# Patient Record
Sex: Female | Born: 1970 | Race: White | Hispanic: No | Smoking: Current every day smoker
Health system: Southern US, Community
[De-identification: ages and names within clinical notes are randomized; demographics above are authoritative.]

## PROBLEM LIST (undated history)

## (undated) DIAGNOSIS — F329 Major depressive disorder, single episode, unspecified: Secondary | ICD-10-CM

## (undated) DIAGNOSIS — B009 Herpesviral infection, unspecified: Secondary | ICD-10-CM

## (undated) DIAGNOSIS — IMO0001 Reserved for inherently not codable concepts without codable children: Secondary | ICD-10-CM

## (undated) DIAGNOSIS — Z8701 Personal history of pneumonia (recurrent): Secondary | ICD-10-CM

## (undated) DIAGNOSIS — K449 Diaphragmatic hernia without obstruction or gangrene: Secondary | ICD-10-CM

## (undated) DIAGNOSIS — F32A Depression, unspecified: Secondary | ICD-10-CM

## (undated) DIAGNOSIS — K219 Gastro-esophageal reflux disease without esophagitis: Secondary | ICD-10-CM

## (undated) HISTORY — DX: Depression, unspecified: F32.A

## (undated) HISTORY — DX: Herpesviral infection, unspecified: B00.9

## (undated) HISTORY — PX: OTHER SURGICAL HISTORY: SHX169

## (undated) HISTORY — PX: TUBAL LIGATION: SHX77

## (undated) HISTORY — DX: Personal history of pneumonia (recurrent): Z87.01

## (undated) HISTORY — DX: Reserved for inherently not codable concepts without codable children: IMO0001

## (undated) HISTORY — DX: Gastro-esophageal reflux disease without esophagitis: K21.9

## (undated) HISTORY — DX: Diaphragmatic hernia without obstruction or gangrene: K44.9

## (undated) HISTORY — DX: Major depressive disorder, single episode, unspecified: F32.9

---

## 1994-02-14 HISTORY — PX: OTHER SURGICAL HISTORY: SHX169

## 1997-09-30 ENCOUNTER — Inpatient Hospital Stay (HOSPITAL_COMMUNITY): Admission: AD | Admit: 1997-09-30 | Discharge: 1997-09-30 | Payer: Self-pay | Admitting: Obstetrics and Gynecology

## 1997-10-02 ENCOUNTER — Inpatient Hospital Stay (HOSPITAL_COMMUNITY): Admission: AD | Admit: 1997-10-02 | Discharge: 1997-10-02 | Payer: Self-pay | Admitting: Obstetrics and Gynecology

## 1997-10-03 ENCOUNTER — Inpatient Hospital Stay (HOSPITAL_COMMUNITY): Admission: AD | Admit: 1997-10-03 | Discharge: 1997-10-05 | Payer: Self-pay | Admitting: Gynecology

## 1997-10-03 ENCOUNTER — Ambulatory Visit (HOSPITAL_COMMUNITY): Admission: RE | Admit: 1997-10-03 | Discharge: 1997-10-03 | Payer: Self-pay | Admitting: Gynecology

## 1997-10-04 ENCOUNTER — Encounter: Payer: Self-pay | Admitting: Gynecology

## 1997-10-28 ENCOUNTER — Inpatient Hospital Stay (HOSPITAL_COMMUNITY): Admission: AD | Admit: 1997-10-28 | Discharge: 1997-10-31 | Payer: Self-pay | Admitting: Gynecology

## 1997-12-10 ENCOUNTER — Ambulatory Visit (HOSPITAL_COMMUNITY): Admission: RE | Admit: 1997-12-10 | Discharge: 1997-12-10 | Payer: Self-pay | Admitting: Surgery

## 1997-12-12 ENCOUNTER — Ambulatory Visit (HOSPITAL_COMMUNITY): Admission: RE | Admit: 1997-12-12 | Discharge: 1997-12-13 | Payer: Self-pay | Admitting: Surgery

## 1997-12-12 ENCOUNTER — Encounter: Payer: Self-pay | Admitting: Surgery

## 1998-12-25 ENCOUNTER — Other Ambulatory Visit: Admission: RE | Admit: 1998-12-25 | Discharge: 1998-12-25 | Payer: Self-pay | Admitting: Gynecology

## 1999-11-09 ENCOUNTER — Ambulatory Visit (HOSPITAL_COMMUNITY): Admission: RE | Admit: 1999-11-09 | Discharge: 1999-11-09 | Payer: Self-pay | Admitting: Family Medicine

## 1999-11-09 ENCOUNTER — Encounter: Payer: Self-pay | Admitting: Family Medicine

## 1999-12-28 ENCOUNTER — Other Ambulatory Visit: Admission: RE | Admit: 1999-12-28 | Discharge: 1999-12-28 | Payer: Self-pay | Admitting: Gynecology

## 2000-11-14 DIAGNOSIS — Z8701 Personal history of pneumonia (recurrent): Secondary | ICD-10-CM

## 2000-11-14 HISTORY — DX: Personal history of pneumonia (recurrent): Z87.01

## 2000-12-29 ENCOUNTER — Other Ambulatory Visit: Admission: RE | Admit: 2000-12-29 | Discharge: 2000-12-29 | Payer: Self-pay | Admitting: Gynecology

## 2001-11-06 ENCOUNTER — Ambulatory Visit (HOSPITAL_COMMUNITY): Admission: RE | Admit: 2001-11-06 | Discharge: 2001-11-06 | Payer: Self-pay | Admitting: Gynecology

## 2001-11-06 ENCOUNTER — Encounter: Payer: Self-pay | Admitting: Gynecology

## 2002-01-03 ENCOUNTER — Inpatient Hospital Stay (HOSPITAL_COMMUNITY): Admission: AD | Admit: 2002-01-03 | Discharge: 2002-01-05 | Payer: Self-pay | Admitting: Gynecology

## 2002-02-12 ENCOUNTER — Other Ambulatory Visit: Admission: RE | Admit: 2002-02-12 | Discharge: 2002-02-12 | Payer: Self-pay | Admitting: Gynecology

## 2003-02-17 ENCOUNTER — Other Ambulatory Visit: Admission: RE | Admit: 2003-02-17 | Discharge: 2003-02-17 | Payer: Self-pay | Admitting: Gynecology

## 2003-03-25 ENCOUNTER — Ambulatory Visit (HOSPITAL_COMMUNITY): Admission: RE | Admit: 2003-03-25 | Discharge: 2003-03-25 | Payer: Self-pay | Admitting: Family Medicine

## 2003-08-14 ENCOUNTER — Other Ambulatory Visit: Admission: RE | Admit: 2003-08-14 | Discharge: 2003-08-14 | Payer: Self-pay | Admitting: Gynecology

## 2004-03-12 ENCOUNTER — Other Ambulatory Visit: Admission: RE | Admit: 2004-03-12 | Discharge: 2004-03-12 | Payer: Self-pay | Admitting: Gynecology

## 2005-03-14 ENCOUNTER — Other Ambulatory Visit: Admission: RE | Admit: 2005-03-14 | Discharge: 2005-03-14 | Payer: Self-pay | Admitting: Gynecology

## 2006-04-06 ENCOUNTER — Other Ambulatory Visit: Admission: RE | Admit: 2006-04-06 | Discharge: 2006-04-06 | Payer: Self-pay | Admitting: Gynecology

## 2007-04-12 ENCOUNTER — Other Ambulatory Visit: Admission: RE | Admit: 2007-04-12 | Discharge: 2007-04-12 | Payer: Self-pay | Admitting: Gynecology

## 2007-12-07 ENCOUNTER — Encounter: Payer: Self-pay | Admitting: Women's Health

## 2007-12-07 ENCOUNTER — Other Ambulatory Visit: Admission: RE | Admit: 2007-12-07 | Discharge: 2007-12-07 | Payer: Self-pay | Admitting: Gynecology

## 2007-12-07 ENCOUNTER — Ambulatory Visit: Payer: Self-pay | Admitting: Women's Health

## 2009-02-09 ENCOUNTER — Other Ambulatory Visit: Admission: RE | Admit: 2009-02-09 | Discharge: 2009-02-09 | Payer: Self-pay | Admitting: Gynecology

## 2009-02-09 ENCOUNTER — Ambulatory Visit: Payer: Self-pay | Admitting: Women's Health

## 2010-05-03 ENCOUNTER — Encounter: Payer: Self-pay | Admitting: Women's Health

## 2010-05-13 ENCOUNTER — Encounter (INDEPENDENT_AMBULATORY_CARE_PROVIDER_SITE_OTHER): Payer: 59 | Admitting: Women's Health

## 2010-05-13 ENCOUNTER — Other Ambulatory Visit: Payer: Self-pay | Admitting: Women's Health

## 2010-05-13 ENCOUNTER — Other Ambulatory Visit (HOSPITAL_COMMUNITY)
Admission: RE | Admit: 2010-05-13 | Discharge: 2010-05-13 | Disposition: A | Discharge status: Home / Self Care | Payer: 59 | Source: Ambulatory Visit | Attending: Gynecology | Admitting: Gynecology

## 2010-05-13 DIAGNOSIS — Z13220 Encounter for screening for lipoid disorders: Secondary | ICD-10-CM

## 2010-05-13 DIAGNOSIS — Z124 Encounter for screening for malignant neoplasm of cervix: Secondary | ICD-10-CM | POA: Insufficient documentation

## 2010-05-13 DIAGNOSIS — Z833 Family history of diabetes mellitus: Secondary | ICD-10-CM

## 2010-05-13 DIAGNOSIS — Z01419 Encounter for gynecological examination (general) (routine) without abnormal findings: Secondary | ICD-10-CM

## 2010-05-13 DIAGNOSIS — R823 Hemoglobinuria: Secondary | ICD-10-CM

## 2010-07-02 NOTE — H&P (Signed)
   NAME:  Angela Bender, Angela Bender                         ACCOUNT NO.:  [id number]   MEDICAL RECORD NO.:  [id number]                   PATIENT TYPE:  OBV   LOCATION:  9169                                 FACILITY:  [facility]   PHYSICIAN:  Katy Fitch, M.D.               DATE OF BIRTH:  08/10/1970   DATE OF ADMISSION:  01/02/2002  DATE OF DISCHARGE:                                HISTORY & PHYSICAL   CHIEF COMPLAINT:  Contractions.   HISTORY OF PRESENT ILLNESS:  The patient is a 40 year old G2, P1 at 75 weeks  who presents to triage complaining of contractions every five minutes. The  patient was noted to be 2 cm dilated and contracting every five minutes on  the monitor. Fetal heart tracing was reactive. The patient was checked in  the office earlier in the day and noted to be 1 to 2 cm. The patient was  admitted for observation, and through observation in the evening, the  patient's contractions increased to approximately three to five minutes  apart and had changed to 9 cm. The patient denied rupture of membranes,  vaginal bleeding, or fever, nausea, vomiting, chills, or change or bowel or  bladder habits.   PAST MEDICAL HISTORY:  The patient does have chronic hypertension and has  not been on any medications this pregnancy.   PAST SURGICAL HISTORY:  Ovarian cyst removal.   MEDICATIONS:  Prenatal vitamins.   ALLERGIES:  None.   SOCIAL HISTORY:  Positive for cigarette usage.   FAMILY HISTORY:  No mental retardation or epithelial cancers.   PHYSICAL EXAMINATION:  VITAL SIGNS:  Blood pressure is 122/80.  HEENT:  Clear.  LUNGS:  Clear to auscultation bilaterally.  HEART:  Regular, rate, and rhythm.  ABDOMEN:  Gravid and nontender. Estimated fetal weight 8 pounds 1 ounce.  CERVIX:  900%, 0 to +1 station, toco every three, fetal heart tones 150,  positive long-term variability.   ASSESSMENT/PLAN:  A 40 year old G2, P1 in active labor. Membranes to be  ruptured. The patient is GBS  negative and will not need any antibiotic  prophylaxis.                                               Katy Fitch, M.D.    DC/MEDQ  D:  01/03/2002  T:  01/03/2002  Job:  [id number]

## 2010-07-02 NOTE — Discharge Summary (Signed)
   NAME:  Angela Bender, Angela Bender                           ACCOUNT NO.:  [id number]   MEDICAL RECORD NO.:  [id number]                   PATIENT TYPE:   LOCATION:                                       FACILITY:   PHYSICIAN:  Ivor Costa. Farrel Gobble, M.D.              DATE OF BIRTH:   DATE OF ADMISSION:  01/03/2002  DATE OF DISCHARGE:  01/05/2002                                 DISCHARGE SUMMARY   DISCHARGE DIAGNOSES:  1. Thirty-nine week intrauterine pregnancy.  2. Chronic hypertension.  3. Spontaneous onset of labor.   PROCEDURE:  Normal spontaneous vaginal delivery of viable infant.  Will  reintact perineum with repair of first-degree laceration.   HOSPITAL COURSE AND HISTORY OF PRESENT ILLNESS:  The patient is a 40-year-  old gravida 2, para 1-0-0-1 with an LMP of April 05, 2001, [facility] January 09, 2002.  Previous risk factors include history of GBS previous pregnancy,  decreased ______, cigarette smoker, chronic high blood pressure, cystic  hygroma with choroid plexus cyst.  Amniocentesis revealed a chromosomally  normal infant.   LABORATORY DATA:  Blood type O-positive.  Antibody screen negative.  RPR  ___________ nonreactive.   HOSPITAL COURSE AND TREATMENT:  The patient was admitted on January 03, 2002 with spontaneous onset of labor.  She progressed to complete  dilatation, delivered an Apgar 89, 6 pounds 12 ounce female infant with  repair of first-degree laceration.   POSTPARTUM COURSE:  The patient remained afebrile and was able to be  discharged in satisfactory condition on her second postpartum day.  CBC with  hematocrit 33.9, hemoglobin 11.5, WBC 16.8, platelet count 227.   DISPOSITION:  Follow up in two and six weeks with over-the-counter Motrin  for pain.     Elwyn Lade . Hancock, N.P.                Ivor Costa. Farrel Gobble, M.D.    MKH/MEDQ  D:  02/04/2002  T:  02/04/2002  Job:  [id number]

## 2010-08-24 ENCOUNTER — Encounter: Payer: Self-pay | Admitting: *Deleted

## 2010-08-26 ENCOUNTER — Ambulatory Visit: Payer: 59 | Admitting: Gynecology

## 2011-04-27 ENCOUNTER — Ambulatory Visit: Payer: 59

## 2011-04-27 ENCOUNTER — Ambulatory Visit (INDEPENDENT_AMBULATORY_CARE_PROVIDER_SITE_OTHER): Payer: 59 | Admitting: Family Medicine

## 2011-04-27 VITALS — BP 148/89 | HR 85 | Temp 99.2°F | Resp 18 | Ht 64.5 in | Wt 236.6 lb

## 2011-04-27 DIAGNOSIS — R059 Cough, unspecified: Secondary | ICD-10-CM

## 2011-04-27 DIAGNOSIS — R05 Cough: Secondary | ICD-10-CM

## 2011-04-27 DIAGNOSIS — R509 Fever, unspecified: Secondary | ICD-10-CM

## 2011-04-27 DIAGNOSIS — J40 Bronchitis, not specified as acute or chronic: Secondary | ICD-10-CM

## 2011-04-27 LAB — POCT CBC
Hemoglobin: 12.2 g/dL (ref 12.2–16.2)
Lymph, poc: 3 (ref 0.6–3.4)
MCH, POC: 27.5 pg (ref 27–31.2)
MCHC: 31 g/dL — AB (ref 31.8–35.4)
MID (cbc): 0.5 (ref 0–0.9)
MPV: 8.6 fL (ref 0–99.8)
POC Granulocyte: 4.7 (ref 2–6.9)
POC MID %: 6.3 %M (ref 0–12)
Platelet Count, POC: 341 10*3/uL (ref 142–424)
RBC: 4.44 M/uL (ref 4.04–5.48)
WBC: 8.2 10*3/uL (ref 4.6–10.2)

## 2011-04-27 LAB — POCT INFLUENZA A/B: Influenza A, POC: NEGATIVE

## 2011-04-27 MED ORDER — IPRATROPIUM BROMIDE 0.06 % NA SOLN: 2.0000 | Freq: Three times a day (TID) | NASAL | Status: DC

## 2011-04-27 MED ORDER — DOXYCYCLINE HYCLATE 100 MG PO CAPS: 100.0000 mg | ORAL_CAPSULE | Freq: Two times a day (BID) | ORAL | Status: AC

## 2011-04-27 MED ORDER — HYDROCODONE-HOMATROPINE 5-1.5 MG/5ML PO SYRP: ORAL_SOLUTION | ORAL | Status: AC

## 2011-04-27 NOTE — Progress Notes (Signed)
Patient ID: JAZYIAH YIU MRN: [id number], DOB: 1970-03-21, 41 y.o. Date of Encounter: 04/27/2011, 11:06 AM  Primary Physician: No primary provider on file.  Chief Complaint:  Chief Complaint  Patient presents with  . Cough  . Nasal Congestion    Started on Saturday  . Fever  . Generalized Body Aches    HPI: 41 y.o. year old female presents with 5 day history of nasal congestion, post nasal drip, sore throat, sinus pressure, and cough. Symptoms improving other than the cough. Tmax 101 three days prior. Nasal congestion thick and green/yellow, and improving. Cough is productive of green/yellow sputum and keeping her up at night. No SOB or wheezing. Normal hearing. Has tried OTC cold preps without success. No GI complaints. Appetite ok. Multiple sick contacts at work with similar symptoms. No recent antibiotics, or recent travels. No history of asthma. Currently smoking tobacco. No flu vaccine.  No leg trauma, sedentary periods, no h/o cancer.  Past Medical History  Diagnosis Date  . H/O: pneumonia 11/2000  . Hypertension   . Reflux   . Hiatal hernia   . Depression      Home Meds: Prior to Admission medications   Medication Sig Start Date End Date Taking? Authorizing Provider  buPROPion (WELLBUTRIN XL) 150 MG 24 hr tablet Take 150 mg by mouth daily.      Historical Provider, MD  norethindrone (MICRONOR) 0.35 MG tablet Take 1 tablet by mouth daily.      Historical Provider, MD    Allergies:  Allergies  Allergen Reactions  . Zoloft Shortness Of Breath    History   Social History  . Marital Status: Married    Spouse Name: N/A    Number of Children: N/A  . Years of Education: N/A   Occupational History  . Not on file.   Social History Main Topics  . Smoking status: Current Everyday Smoker -- 0.5 packs/day    Types: Cigarettes  . Smokeless tobacco: Not on file  . Alcohol Use: Not on file  . Drug Use: Not on file  . Sexually Active: Not on file   Other Topics  Concern  . Not on file   Social History Narrative  . No narrative on file     Review of Systems: Constitutional: negative for night sweats or weight changes Cardiovascular: negative for chest pain or palpitations Respiratory: negative for hemoptysis, wheezing, or shortness of breath Abdominal: negative for abdominal pain, nausea, vomiting or diarrhea Dermatological: negative for rash Neurologic: negative for headache   Physical Exam: Blood pressure 148/89, pulse 106, temperature 99.2 F (37.3 C), temperature source Oral, resp. rate 18, height 5' 4.5" (1.638 m), weight 236 lb 9.6 oz (107.321 kg), SpO2 93.00%., Body mass index is 39.99 kg/(m^2). Pulse recheck at 85 and pulse ox recheck at 98% on room air. General: Well developed, well nourished, in no acute distress. Head: Normocephalic, atraumatic, eyes without discharge, sclera non-icteric, nares are congested. Bilateral auditory canals clear, TM's are without perforation, pearly grey with reflective cone of light bilaterally. Serous effusion bilaterally behind TM's. No sinus TTP. Oral cavity moist, dentition normal. Posterior pharynx with post nasal drip and mild erythema. No peritonsillar abscess or tonsillar exudate. Neck: Supple. No thyromegaly. Full ROM. No lymphadenopathy. Lungs: Coarse breath sounds bilaterally without wheezes, rales, or rhonchi. Breathing is unlabored.  Heart: RRR with S1 S2. No murmurs, rubs, or gallops appreciated. Msk:  Strength and tone normal for age. Calves supple without erythema or swelling. Extremities: No clubbing  or cyanosis. No edema. Neuro: Alert and oriented X 3. Moves all extremities spontaneously. CNII-XII grossly in tact. Psych:  Responds to questions appropriately with a normal affect.   Labs: Results for orders placed in visit on 04/27/11  POCT CBC      Component Value Range   WBC 8.2  4.6 - 10.2 (K/uL)   Lymph, poc 3.0  0.6 - 3.4    POC LYMPH PERCENT 36.0  10 - 50 (%L)   MID (cbc) 0.5   0 - 0.9    POC MID % 6.3  0 - 12 (%M)   POC Granulocyte 4.7  2 - 6.9    Granulocyte percent 57.7  37 - 80 (%G)   RBC 4.44  4.04 - 5.48 (M/uL)   Hemoglobin 12.2  12.2 - 16.2 (g/dL)   HCT, POC 91.4  78.2 - 47.9 (%)   MCV 88.5  80 - 97 (fL)   MCH, POC 27.5  27 - 31.2 (pg)   MCHC 31.0 (*) 31.8 - 35.4 (g/dL)   RDW, POC 95.6     Platelet Count, POC 341  142 - 424 (K/uL)   MPV 8.6  0 - 99.8 (fL)  POCT INFLUENZA A/B      Component Value Range   Influenza A, POC Negative     Influenza B, POC Negative       [facility] reading (PRIMARY) by  Dr. Patsy Lager. Negative    ASSESSMENT AND PLAN:  41 y.o. year old female with bronchitis and cough -Doxycycline 100 mg #20 1 po bid no RF -Atrovent NS 0.06% 2 sprays each nare bid prn #1 no RF -Hycodan #4oz 1 tsp po q 4-6 hours prn cough no RF SED -Mucinex -Tylenol/Motrin prn -Rest/fluids -RTC/ER precautions -RTC 2 days if no improvement -Discussed with patient in detail regarding PE/DVTs. She understands the risks involved with these illnesses and decides in a sound state of mind to defer a D-dimer, CTA, and lower extremity US.    Signed, Eula Listen, PA-C 04/27/2011 11:06 AM

## 2011-05-02 ENCOUNTER — Ambulatory Visit (INDEPENDENT_AMBULATORY_CARE_PROVIDER_SITE_OTHER): Payer: 59 | Admitting: Family Medicine

## 2011-05-02 VITALS — BP 138/90 | HR 120 | Temp 98.6°F | Resp 20 | Ht 64.5 in | Wt 235.0 lb

## 2011-05-02 DIAGNOSIS — R5383 Other fatigue: Secondary | ICD-10-CM

## 2011-05-02 DIAGNOSIS — R5381 Other malaise: Secondary | ICD-10-CM

## 2011-05-02 DIAGNOSIS — R1110 Vomiting, unspecified: Secondary | ICD-10-CM

## 2011-05-02 DIAGNOSIS — R110 Nausea: Secondary | ICD-10-CM

## 2011-05-02 DIAGNOSIS — R112 Nausea with vomiting, unspecified: Secondary | ICD-10-CM

## 2011-05-02 LAB — POCT CBC
HCT, POC: 45.5 % (ref 37.7–47.9)
Hemoglobin: 14.8 g/dL (ref 12.2–16.2)
MPV: 8.7 fL (ref 0–99.8)
POC Granulocyte: 6.8 (ref 2–6.9)
POC MID %: 5.7 %M (ref 0–12)
RBC: 5.11 M/uL (ref 4.04–5.48)

## 2011-05-02 MED ORDER — ONDANSETRON 4 MG PO TBDP
8.0000 mg | ORAL_TABLET | Freq: Once | ORAL | Status: AC
  Administered 2011-05-02: 8 mg via ORAL

## 2011-05-02 MED ORDER — ONDANSETRON HCL 8 MG PO TABS: 8.0000 mg | ORAL_TABLET | Freq: Three times a day (TID) | ORAL | Status: AC | PRN

## 2011-05-02 NOTE — Progress Notes (Signed)
Patient Name: Angela Bender Date of Birth: May 10, 1970 Medical Record Number: [id number] Gender: female Date of Encounter: 05/02/2011  History of Present Illness:  Angela Bender is a 41 y.o. very pleasant female patient who presents with the following:  Here to recheck illness- see OV 04/27/11.  She is not feeling a lot better yet- nausea started yesterday, vomited this morning.  vomited once so far.  Had diarrhea twice yesterday.  No abdominal pain.   Her cough has resolved, but she still feels that she has chest congestion.  Blowing/ coughing up green and yellow secretions.  Has felt hot/ had sweats and chills but no fever.  Temperature last week but not greater than 100 or 101.   Takes continyous OCP so amenorrheic but she does not feel there is a risk of pregnancy She still just feels worn out and does not think she can RTW today  There is no problem list on file for this patient.  Past Medical History  Diagnosis Date  . H/O: pneumonia 11/2000  . Hypertension   . Reflux   . Hiatal hernia   . Depression    Past Surgical History  Procedure Date  . Cyst removed from left adnexa 1996  . Chloecystectomy    History  Substance Use Topics  . Smoking status: Current Everyday Smoker -- 0.5 packs/day    Types: Cigarettes  . Smokeless tobacco: Not on file  . Alcohol Use: Not on file   Family History  Problem Relation Age of Onset  . Hypertension Mother    Allergies  Allergen Reactions  . Zoloft Shortness Of Breath    Medication list has been reviewed and updated.  Review of Systems: As per HPI- otherwise negative. Feels tired and achy  Physical Examination: Filed Vitals:   05/02/11 0928  BP: 138/90  Pulse: 120  Temp: 98.6 F (37 C)  TempSrc: Oral  Resp: 20  Height: 5' 4.5" (1.638 m)  Weight: 235 lb (106.595 kg)   02 sat 98%, recheck pulse 100 BPM Body mass index is 39.71 kg/(m^2).  GEN: WDWN, NAD, Non-toxic, A & O x 3, obese HEENT: Atraumatic,  Normocephalic. Neck supple. No masses, No LAD.  TM and oropharynx wnl Ears and Nose: No external deformity. CV: RRR, No M/G/R. No JVD. No thrill. No extra heart sounds. PULM: CTA B, no wheezes, crackles, rhonchi. No retractions. No resp. distress. No accessory muscle use. ABD: S, NT, ND, +BS. No rebound. No HSM. EXTR: No c/c/e NEURO Normal gait.  PSYCH: Normally interactive. Conversant. Not depressed or anxious appearing.  Calm demeanor.   Results for orders placed in visit on 05/02/11  POCT CBC      Component Value Range   WBC 10.4 (*) 4.6 - 10.2 (K/uL)   Lymph, poc 3.0  0.6 - 3.4    POC LYMPH PERCENT 28.6  10 - 50 (%L)   MID (cbc) 0.6  0 - 0.9    POC MID % 5.7  0 - 12 (%M)   POC Granulocyte 6.8  2 - 6.9    Granulocyte percent 65.7  37 - 80 (%G)   RBC 5.11  4.04 - 5.48 (M/uL)   Hemoglobin 14.8  12.2 - 16.2 (g/dL)   HCT, POC 95.2  84.1 - 47.9 (%)   MCV 89.0  80 - 97 (fL)   MCH, POC 29.0  27 - 31.2 (pg)   MCHC 32.5  31.8 - 35.4 (g/dL)   RDW, POC 13.6  Platelet Count, POC 405  142 - 424 (K/uL)   MPV 8.7  0 - 99.8 (fL)  POCT URINE PREGNANCY      Component Value Range   Preg Test, Ur Negative     Assessment and Plan: 1. Nausea  POCT urine pregnancy, ondansetron (ZOFRAN) 8 MG tablet  2. Fatigue  POCT CBC  3. Congestion    4. Vomiting  ondansetron (ZOFRAN-ODT) disintegrating tablet 8 mg   Gave zofran in clinic and she felt better.  Will allow to go home with zofran rx, continue doxy, push fluids.  Patient (or parent if minor) instructed to return to clinic or call if not better in 2 day(s). Sooner if worse.

## 2011-06-24 ENCOUNTER — Encounter: Payer: 59 | Admitting: Women's Health

## 2011-06-27 ENCOUNTER — Other Ambulatory Visit: Payer: Self-pay | Admitting: Women's Health

## 2011-06-30 ENCOUNTER — Encounter: Payer: 59 | Admitting: Women's Health

## 2011-07-12 ENCOUNTER — Encounter: Payer: 59 | Admitting: Gynecology

## 2011-07-26 ENCOUNTER — Other Ambulatory Visit: Payer: Self-pay | Admitting: Women's Health

## 2011-08-02 ENCOUNTER — Telehealth: Payer: Self-pay | Admitting: *Deleted

## 2011-08-02 ENCOUNTER — Encounter: Payer: Self-pay | Admitting: Gynecology

## 2011-08-02 ENCOUNTER — Other Ambulatory Visit: Payer: Self-pay | Admitting: *Deleted

## 2011-08-02 ENCOUNTER — Ambulatory Visit (INDEPENDENT_AMBULATORY_CARE_PROVIDER_SITE_OTHER): Payer: 59 | Admitting: Gynecology

## 2011-08-02 VITALS — BP 124/70 | Ht 64.75 in | Wt 230.0 lb

## 2011-08-02 DIAGNOSIS — Z3049 Encounter for surveillance of other contraceptives: Secondary | ICD-10-CM

## 2011-08-02 DIAGNOSIS — Z01419 Encounter for gynecological examination (general) (routine) without abnormal findings: Secondary | ICD-10-CM

## 2011-08-02 DIAGNOSIS — N951 Menopausal and female climacteric states: Secondary | ICD-10-CM

## 2011-08-02 DIAGNOSIS — Z131 Encounter for screening for diabetes mellitus: Secondary | ICD-10-CM

## 2011-08-02 DIAGNOSIS — Z13220 Encounter for screening for lipoid disorders: Secondary | ICD-10-CM

## 2011-08-02 LAB — CBC WITH DIFFERENTIAL/PLATELET
Eosinophils Absolute: 0.2 10*3/uL (ref 0.0–0.7)
Eosinophils Relative: 2 % (ref 0–5)
HCT: 41.2 % (ref 36.0–46.0)
Lymphocytes Relative: 24 % (ref 12–46)
Lymphs Abs: 2.5 10*3/uL (ref 0.7–4.0)
MCH: 28.8 pg (ref 26.0–34.0)
MCV: 86.7 fL (ref 78.0–100.0)
Monocytes Absolute: 0.5 10*3/uL (ref 0.1–1.0)
Platelets: 395 10*3/uL (ref 150–400)
RBC: 4.75 MIL/uL (ref 3.87–5.11)
WBC: 10.6 10*3/uL — ABNORMAL HIGH (ref 4.0–10.5)

## 2011-08-02 LAB — LIPID PANEL
Cholesterol: 191 mg/dL (ref 0–200)
HDL: 40 mg/dL (ref >39)
LDL Cholesterol: 124 mg/dL — ABNORMAL HIGH (ref 0–99)
Total CHOL/HDL Ratio: 4.8 Ratio
Triglycerides: 137 mg/dL (ref <150)
VLDL: 27 mg/dL (ref 0–40)

## 2011-08-02 LAB — FOLLICLE STIMULATING HORMONE: FSH: 4.2 m[IU]/mL

## 2011-08-02 MED ORDER — NORETHINDRONE 0.35 MG PO TABS: 1.0000 | ORAL_TABLET | Freq: Every day | ORAL | Status: DC

## 2011-08-02 MED ORDER — LEVONORGESTREL 20 MCG/24HR IU IUD: INTRAUTERINE_SYSTEM | Freq: Once | INTRAUTERINE | Status: DC

## 2011-08-02 NOTE — Progress Notes (Signed)
Angela Bender Sep 05, 1970 [id number]        41 y.o.  for annual exam.  Several issues noted below.  Past medical history,surgical history, medications, allergies, family history and social history were all reviewed and documented in the EPIC chart. ROS:  Was performed and pertinent positives and negatives are included in the history.  Exam: Angela Bender chaperone present Filed Vitals:   08/02/11 1205  BP: 124/70   General appearance  Normal Skin grossly normal Head/Neck normal with no cervical or supraclavicular adenopathy thyroid normal Lungs  clear Cardiac RR, without RMG Abdominal  soft, nontender, without masses, organomegaly or hernia Breasts  examined lying and sitting without masses, retractions, discharge or axillary adenopathy. Pelvic  Ext/BUS/vagina  normal   Cervix  normal   Uterus  anteverted, normal size, shape and contour, midline and mobile nontender   Adnexa  Without masses or tenderness    Anus and perineum  normal   Rectovaginal  normal sphincter tone without palpated masses or tenderness.    Assessment/Plan:  41 y.o. female for annual exam.    1. Contraception. Patients on progesterone only oral contraceptives. She does smoke and is over age 18. I reviewed all options of contraception. The need to avoid estrogen containing due to increased risk of thrombosis stroke heart attack DVT reviewed. Alternatives to include Nexplanon, Mirena IUD, ParaGard, sterilization both female and female were all reviewed with her. She wants to continue on progesterone only. I discussed the need to take it everyday at the same time and a higher failure risk.  I asked her to consider Mirena IUD and give her literature on this.  Refilled her Camila x1 year 2. Flushes. Patient notes episodes of hot flashes during the day. Happen sporadically throughout the month. No weight changes hair changes skin changes. Will check TSH and FSH. 3. Mammography. Patients do this year I urged her to schedule this  and she agrees to do so. SBE monthly reviewed. 4. Pap smear. Last Pap smear 2012. No history of abnormal Pap smears. No Pap done today. I reviewed current screening guidelines we'll plan every 3-5 year Pap. 5. Depression. Patient is being seen by a psychiatrist who manages her antidepressive medications. 6. Health maintenance. Baseline CBC lipid profile glucose urinalysis ordered along with her TSH and FSH.   Angela Lords MD, 12:34 PM 08/02/2011

## 2011-08-02 NOTE — Telephone Encounter (Signed)
Patient informed Mirena IUD covered at 100%.  Will call back with period to set up insert with TF.

## 2011-08-02 NOTE — Progress Notes (Signed)
Mirena covered 100%

## 2011-08-02 NOTE — Patient Instructions (Signed)
Consider Mirena IUD as we discussed. Schedule mammogram. Follow up in one year for annual exam.

## 2011-08-03 ENCOUNTER — Encounter: Payer: Self-pay | Admitting: Gynecology

## 2011-10-31 ENCOUNTER — Ambulatory Visit (INDEPENDENT_AMBULATORY_CARE_PROVIDER_SITE_OTHER): Payer: 59

## 2011-10-31 ENCOUNTER — Ambulatory Visit (INDEPENDENT_AMBULATORY_CARE_PROVIDER_SITE_OTHER): Payer: 59 | Admitting: Women's Health

## 2011-10-31 ENCOUNTER — Encounter: Payer: Self-pay | Admitting: Women's Health

## 2011-10-31 ENCOUNTER — Other Ambulatory Visit: Payer: Self-pay | Admitting: Gynecology

## 2011-10-31 DIAGNOSIS — N83209 Unspecified ovarian cyst, unspecified side: Secondary | ICD-10-CM

## 2011-10-31 DIAGNOSIS — F17200 Nicotine dependence, unspecified, uncomplicated: Secondary | ICD-10-CM

## 2011-10-31 DIAGNOSIS — N831 Corpus luteum cyst of ovary, unspecified side: Secondary | ICD-10-CM

## 2011-10-31 DIAGNOSIS — N949 Unspecified condition associated with female genital organs and menstrual cycle: Secondary | ICD-10-CM

## 2011-10-31 DIAGNOSIS — R102 Pelvic and perineal pain unspecified side: Secondary | ICD-10-CM

## 2011-10-31 DIAGNOSIS — F329 Major depressive disorder, single episode, unspecified: Secondary | ICD-10-CM

## 2011-10-31 DIAGNOSIS — N8300 Follicular cyst of ovary, unspecified side: Secondary | ICD-10-CM

## 2011-10-31 DIAGNOSIS — F17210 Nicotine dependence, cigarettes, uncomplicated: Secondary | ICD-10-CM | POA: Insufficient documentation

## 2011-10-31 DIAGNOSIS — N83201 Unspecified ovarian cyst, right side: Secondary | ICD-10-CM

## 2011-10-31 DIAGNOSIS — F32A Depression, unspecified: Secondary | ICD-10-CM

## 2011-10-31 MED ORDER — IBUPROFEN 600 MG PO TABS: 600.0000 mg | ORAL_TABLET | Freq: Three times a day (TID) | ORAL | Status: AC | PRN

## 2011-10-31 NOTE — Progress Notes (Signed)
Patient ID: Angela Bender, female   DOB: June 06, 1970, 41 y.o.   MRN: [id number] Presents with the complaint of right lateral hip pain that radiates to lower right quadrant for approximately one month. States unable to sleep on right side, the pain increases going upstairs, walking, pain less at rest. Rates pain at 3 when sitting, 5-6 when walking and moving. Denies vaginal discharge, UTI symptoms, fever, change in bowel limitation or injury to hip. Currently on Micronor, smokes less than a half a pack per day. Planning to have Mirena IUD placed with next cycle by Dr. Audie Box.  Exam: No CVAT, abdomen obese, no pain, radiation, rebound of pain minimal pain with deep palpation to lower right quadrant. External genitalia within normal limits, speculum exam scant discharge no odor or erythema noted. Bimanual no CMT or adnexal tenderness with exam. Ultrasound: anteverted uterus, endometrium 1.3 mm, right ovary 2 thin-walled echo-free avascular cyst: 36 x 27 x 30 mm, 26 x 24 x 25 mm. Left ovary with thickwalled follicle avascular. Moderate amount of free fluid in cul-de-sac 44 x 25 mm.  Right ovarian cysts Right hip pain  Plan: Motrin 600 every 8 hours for 1 week, repeat ultrasound in after 2-3 cycles to check for resolution of cysts, reviewed cyst may be resolving at this time. Reviewed pain appears to be more in hip, questionable arthritis type pain. Return to office for Mirena IUD. Instructed to call if hip pain persists and for followup ultrasound.

## 2011-10-31 NOTE — Patient Instructions (Addendum)
Ovarian Cyst The ovaries are small organs that are on each side of the uterus. The ovaries are the organs that produce the female hormones, estrogen and progesterone. An ovarian cyst is a sac filled with fluid that can vary in its size. It is normal for a small cyst to form in women who are in the childbearing age and who have menstrual periods. This type of cyst is called a follicle cyst that becomes an ovulation cyst (corpus luteum cyst) after it produces the women's egg. It later goes away on its own if the woman does not become pregnant. There are other kinds of ovarian cysts that may cause problems and may need to be treated. The most serious problem is a cyst with cancer. It should be noted that menopausal women who have an ovarian cyst are at a higher risk of it being a cancer cyst. They should be evaluated very quickly, thoroughly and followed closely. This is especially true in menopausal women because of the high rate of ovarian cancer in women in menopause. CAUSES AND TYPES OF OVARIAN CYSTS:  FUNCTIONAL CYST: The follicle/corpus luteum cyst is a functional cyst that occurs every month during ovulation with the menstrual cycle. They go away with the next menstrual cycle if the woman does not get pregnant. Usually, there are no symptoms with a functional cyst.   ENDOMETRIOMA CYST: This cyst develops from the lining of the uterus tissue. This cyst gets in or on the ovary. It grows every month from the bleeding during the menstrual period. It is also called a "chocolate cyst" because it becomes filled with blood that turns brown. This cyst can cause pain in the lower abdomen during intercourse and with your menstrual period.   CYSTADENOMA CYST: This cyst develops from the cells on the outside of the ovary. They usually are not cancerous. They can get very big and cause lower abdomen pain and pain with intercourse. This type of cyst can twist on itself, cut off its blood supply and cause severe pain.  It also can easily rupture and cause a lot of pain.   DERMOID CYST: This type of cyst is sometimes found in both ovaries. They are found to have different kinds of body tissue in the cyst. The tissue includes skin, teeth, hair, and/or cartilage. They usually do not have symptoms unless they get very big. Dermoid cysts are rarely cancerous.   POLYCYSTIC OVARY: This is a rare condition with hormone problems that produces many small cysts on both ovaries. The cysts are follicle-like cysts that never produce an egg and become a corpus luteum. It can cause an increase in body weight, infertility, acne, increase in body and facial hair and lack of menstrual periods or rare menstrual periods. Many women with this problem develop type 2 diabetes. The exact cause of this problem is unknown. A polycystic ovary is rarely cancerous.   THECA LUTEIN CYST: Occurs when too much hormone (human chorionic gonadotropin) is produced and over-stimulates the ovaries to produce an egg. They are frequently seen when doctors stimulate the ovaries for invitro-fertilization (test tube babies).   LUTEOMA CYST: This cyst is seen during pregnancy. Rarely it can cause an obstruction to the birth canal during labor and delivery. They usually go away after delivery.  SYMPTOMS   Pelvic pain or pressure.   Pain during sexual intercourse.   Increasing girth (swelling) of the abdomen.   Abnormal menstrual periods.   Increasing pain with menstrual periods.   You stop having   menstrual periods and you are not pregnant.  DIAGNOSIS  The diagnosis can be made during:  Routine or annual pelvic examination (common).   Ultrasound.   X-ray of the pelvis.   CT Scan.   MRI.   Blood tests.  TREATMENT   Treatment may only be to follow the cyst monthly for 2 to 3 months with your caregiver. Many go away on their own, especially functional cysts.   May be aspirated (drained) with a long needle with ultrasound, or by laparoscopy  (inserting a tube into the pelvis through a small incision).   The whole cyst can be removed by laparoscopy.   Sometimes the cyst may need to be removed through an incision in the lower abdomen.   Hormone treatment is sometimes used to help dissolve certain cysts.   Birth control pills are sometimes used to help dissolve certain cysts.  HOME CARE INSTRUCTIONS  Follow your caregiver's advice regarding:  Medicine.   Follow up visits to evaluate and treat the cyst.   You may need to come back or make an appointment with another caregiver, to find the exact cause of your cyst, if your caregiver is not a gynecologist.   Get your yearly and recommended pelvic examinations and Pap tests.   Let your caregiver know if you have had an ovarian cyst in the past.  SEEK MEDICAL CARE IF:   Your periods are late, irregular, they stop, or are painful.   Your stomach (abdomen) or pelvic pain does not go away.   Your stomach becomes larger or swollen.   You have pressure on your bladder or trouble emptying your bladder completely.   You have painful sexual intercourse.   You have feelings of fullness, pressure, or discomfort in your stomach.   You lose weight for no apparent reason.   You feel generally ill.   You become constipated.   You lose your appetite.   You develop acne.   You have an increase in body and facial hair.   You are gaining weight, without changing your exercise and eating habits.   You think you are pregnant.  SEEK IMMEDIATE MEDICAL CARE IF:   You have increasing abdominal pain.   You feel sick to your stomach (nausea) and/or vomit.   You develop a fever that comes on suddenly.   You develop abdominal pain during a bowel movement.   Your menstrual periods become heavier than usual.  Document Released: 01/31/2005 Document Revised: 01/20/2011 Document Reviewed: 12/04/2008 [employer] Patient Information 2012 [employer]. 

## 2011-11-24 ENCOUNTER — Telehealth: Payer: Self-pay | Admitting: *Deleted

## 2011-11-24 NOTE — Telephone Encounter (Signed)
Telephone call, has had problems with persistent right lower quadrant discomfort/ovarian cyst. Requesting a tubal ligation with Dr. Audie Box and questions cyst removal. Will schedule office visit with Dr. Audie Box to discuss. Switched to appointments.

## 2011-11-24 NOTE — Telephone Encounter (Signed)
Pt spoke with insurance company and they will pay 100% for tubal ligation and she will have to pay 20% out of pocket for cyst removal. Should pt set up OV with TF to discuss?

## 2011-11-24 NOTE — Telephone Encounter (Signed)
Pt decided that she doesn't want to have Mirena IUD, she very concerned about some of the issues with the IUD. She asked if having her tubal ligation would be possible? She is not planning to have any children, and still having the right sided cyst pain. She just thought long term this would probably be best. She would like to know if you thinks this would be best for her to solve all the above? Call back # [phone]

## 2011-11-24 NOTE — Telephone Encounter (Signed)
Telephone call, reviewed does not want to have an IUD, requesting BTL. Will call insurance company to check coverage also reviewed vasectomy. States Micronor birth control pills her now $35/month. Will call back when makes a decision.

## 2011-12-02 ENCOUNTER — Encounter: Payer: Self-pay | Admitting: Gynecology

## 2011-12-02 ENCOUNTER — Ambulatory Visit (INDEPENDENT_AMBULATORY_CARE_PROVIDER_SITE_OTHER): Payer: 59 | Admitting: Gynecology

## 2011-12-02 VITALS — BP 124/78

## 2011-12-02 DIAGNOSIS — N951 Menopausal and female climacteric states: Secondary | ICD-10-CM

## 2011-12-02 DIAGNOSIS — Z309 Encounter for contraceptive management, unspecified: Secondary | ICD-10-CM

## 2011-12-02 LAB — FOLLICLE STIMULATING HORMONE: FSH: 8 m[IU]/mL

## 2011-12-02 LAB — TSH: TSH: 1.185 u[IU]/mL (ref 0.350–4.500)

## 2011-12-02 NOTE — Progress Notes (Signed)
Angela Bender 22-Sep-1970 [id number]    41 y.o. G2P2002 on continuous progesterone only pill who presents to discuss tubal sterilization. She also recently was evaluated for some pelvic discomfort and has what appears to be functional ovarian cystic changes. The plan at that point was for expectant management. I reviewed options for contraception to include whole patch and ring Depo-Provera Implanon IUD sterilization both female and female to include laparoscopic and essure. She is 40 and does smoke.   Past medical history,surgical history, medications, allergies, family history and social history were all reviewed and documented in the EPIC chart. ROS:  Was performed and pertinent positives and negatives are included in the history of present illness.  Exam:  Kim assistant General: well developed, well nourished female, no acute distress HEENT: normal  Lungs: clear to auscultation without wheezing, rales or rhonchi  Cardiac: regular rate without rubs, murmurs or gallops  Abdomen: soft, nontender without masses, guarding, rebound, organomegaly  Pelvic: external bus vagina: normal   Cervix: grossly normal  Uterus: normal size, midline and mobile, nontender  Adnexa: without masses or tenderness  Rectovaginal exam within normal limits     Assessment/Plan:  41 y.o. [id number] with above discussion wants to proceed with laparoscopic tubal sterilization. Also wants me to assess her ovaries and if residual cystic changes to have these removed also. She understands that her ovaries are more likely physiologic in that she is at continued risk for persistent sore redevelopment of cysts and accepts this. Was involved with laparoscopic tubal sterilization was reviewed to include the expected intraoperative postoperative courses, multiple port sites insufflation trocar placement use of Falope-Rings with bipolar cautery back up. The risks of infection, prolonged antibiotics, incisional complications requiring  opening and draining of incisions, closure by secondary intention and long-term issues of cosmetics/keloid and hernia formation reviewed. The risk of hemorrhage necessitating transfusion and the risks of transfusion discussed to include transfusion reaction hepatitis HIV mad cow disease and other unknown entities. The risk of inadvertent injury to internal organs either immediately recognized or delay recognized including bowel bladder ureters vessels and nerves necessitating major exploratory reparative surgeries and future reparative surgeries including ostomy formation bowel resection bladder repair ureteral damage repair was all discussed understood and accepted. The absolute and irreversible sterility associated with tubal sterilization was discussed as well as the possibility of failure and subsequent pregnancy. The issues of "post tubal syndrome" with irregular heavy menses following the tubal sterilization was also discussed. The patient's questions were answered to her satisfaction and she is ready to schedule wants to proceed with surgery.    Dara Lords MD, 11:22 AM 12/02/2011

## 2011-12-02 NOTE — Patient Instructions (Signed)
Office will contact you to arrange surgery. 

## 2011-12-08 ENCOUNTER — Encounter (HOSPITAL_COMMUNITY): Payer: Self-pay | Admitting: Pharmacist

## 2011-12-08 NOTE — H&P (Signed)
  Angela Bender November 28, 1970 [id number]   History and Physical  Chief complaint: desires permanent sterilization, right ovarian cyst  History of present illness: 41 y.o. G2P2002 on continuous progesterone only pill who desires tubal sterilization. She also recently was evaluated for some pelvic discomfort and has what appears to be functional right ovarian cystic changes. Options for contraception to include pill, patch and ring Depo-Provera, Implanon, IUD, sterilization both female and female to include laparoscopic and essure were reviewed.   She is 40 and does smoke. The patient elects for tubal sterilization and possible removal of any ovarian cystic changes encountered.   Past medical history,surgical history, medications, allergies, family history and social history were all reviewed and documented in the EPIC chart.  ROS: Was performed and pertinent positives and negatives are included in the history of present illness.   Exam: Kim assistant  General: well developed, well nourished female, no acute distress  HEENT: normal  Lungs: clear to auscultation without wheezing, rales or rhonchi  Cardiac: regular rate without rubs, murmurs or gallops  Abdomen: soft, nontender without masses, guarding, rebound, organomegaly  Pelvic: external bus vagina: normal  Cervix: grossly normal  Uterus: normal size, midline and mobile, nontender  Adnexa: without masses or tenderness  Rectovaginal exam within normal limits    Assessment/Plan: 41 y.o. [id number] with above discussion wants to proceed with laparoscopic tubal sterilization. Also wants me to assess her ovaries and if residual cystic changes to have these removed also. She understands that her ovaries are more likely physiologic and that she is at continued risk for persistent cyst redevelopment and continued pain and accepts this. What is involved with laparoscopic tubal sterilization was reviewed to include the expected intraoperative postoperative  courses, multiple port sites, insufflation, trocar placement, use of Falope-Rings with bipolar cautery back up. The risks of infection, prolonged antibiotics, incisional complications requiring opening and draining of incisions, closure by secondary intention and long-term issues of cosmetics/keloid and hernia formation reviewed. The risk of hemorrhage necessitating transfusion and the risks of transfusion discussed to include transfusion reaction, hepatitis, HIV, mad cow disease and other unknown entities. The risk of inadvertent injury to internal organs, either immediately recognized or delay recognized, including bowel, bladder, ureters, vessels and nerves necessitating major exploratory reparative surgeries and future reparative surgeries including ostomy formation, bowel resection, bladder repair, ureteral damage repair was all discussed understood and accepted. The absolute and irreversible sterility associated with tubal sterilization was discussed as well as the possibility of failure and subsequent pregnancy. The issues of "post tubal syndrome" with irregular heavy menses following the tubal sterilization was also discussed. The patient's questions were answered to her satisfaction and she is ready to proceed with surgery.    Dara Lords MD, 11:10 AM 12/08/2011

## 2011-12-12 ENCOUNTER — Encounter (HOSPITAL_COMMUNITY)
Admission: RE | Admit: 2011-12-12 | Discharge: 2011-12-12 | Disposition: A | Discharge status: Home / Self Care | Payer: 59 | Source: Ambulatory Visit | Attending: Gynecology | Admitting: Gynecology

## 2011-12-12 ENCOUNTER — Encounter (HOSPITAL_COMMUNITY): Payer: Self-pay

## 2011-12-12 LAB — SURGICAL PCR SCREEN
MRSA, PCR: NEGATIVE
Staphylococcus aureus: NEGATIVE

## 2011-12-12 LAB — CBC
MCH: 29.6 pg (ref 26.0–34.0)
MCHC: 32.7 g/dL (ref 30.0–36.0)
Platelets: 306 10*3/uL (ref 150–400)
RDW: 14.3 % (ref 11.5–15.5)

## 2011-12-12 NOTE — Patient Instructions (Addendum)
   Your procedure is scheduled WU:[id number] October 31st  Enter through the Main Entrance of [facility] at:11:30am Pick up the phone at the desk and dial [phone] and inform us of your arrival.  Please call this number if you have any problems the morning of surgery: [phone]  Remember: Do not eat food after midnight:on Wednesday You may have clear liquids until 9am on Thursday then nothing Please take your omeprazole, pristiq, and wellbutrin morning of surgery   Do not wear jewelry, make-up, or FINGER nail polish No metal in your hair or on your body. Do not wear lotions, powders, perfumes. You may wear deodorant.  Please use your CHG wash as directed prior to surgery.  Do not shave anywhere for at least 12 hours prior to first CHG shower.  Do not bring valuables to the hospital.   Patients discharged on the day of surgery will not be allowed to drive home.

## 2011-12-14 MED ORDER — DEXTROSE 5 % IV SOLN
2.0000 g | INTRAVENOUS | Status: AC
  Administered 2011-12-15: 2 g via INTRAVENOUS
  Filled 2011-12-14: qty 2

## 2011-12-15 ENCOUNTER — Encounter (HOSPITAL_COMMUNITY)
Admission: RE | Disposition: A | Discharge status: Home / Self Care | Payer: Self-pay | Source: Ambulatory Visit | Attending: Gynecology

## 2011-12-15 ENCOUNTER — Ambulatory Visit (HOSPITAL_COMMUNITY)
Admission: RE | Admit: 2011-12-15 | Discharge: 2011-12-15 | Disposition: A | Discharge status: Home / Self Care | Payer: 59 | Source: Ambulatory Visit | Attending: Gynecology | Admitting: Gynecology

## 2011-12-15 ENCOUNTER — Encounter (HOSPITAL_COMMUNITY): Payer: Self-pay | Admitting: Anesthesiology

## 2011-12-15 ENCOUNTER — Ambulatory Visit (HOSPITAL_COMMUNITY): Payer: 59 | Admitting: Anesthesiology

## 2011-12-15 DIAGNOSIS — N83209 Unspecified ovarian cyst, unspecified side: Secondary | ICD-10-CM | POA: Insufficient documentation

## 2011-12-15 DIAGNOSIS — Z01818 Encounter for other preprocedural examination: Secondary | ICD-10-CM | POA: Insufficient documentation

## 2011-12-15 DIAGNOSIS — Z302 Encounter for sterilization: Secondary | ICD-10-CM | POA: Insufficient documentation

## 2011-12-15 DIAGNOSIS — N949 Unspecified condition associated with female genital organs and menstrual cycle: Secondary | ICD-10-CM | POA: Insufficient documentation

## 2011-12-15 DIAGNOSIS — Z01812 Encounter for preprocedural laboratory examination: Secondary | ICD-10-CM | POA: Insufficient documentation

## 2011-12-15 HISTORY — PX: LAPAROSCOPY: SHX197

## 2011-12-15 HISTORY — PX: OVARIAN CYST REMOVAL: SHX89

## 2011-12-15 HISTORY — PX: LAPAROSCOPIC TUBAL LIGATION: SHX1937

## 2011-12-15 LAB — HCG, SERUM, QUALITATIVE: Preg, Serum: NEGATIVE

## 2011-12-15 SURGERY — LAPAROSCOPY OPERATIVE
Anesthesia: General | Site: Abdomen | Laterality: Right | Wound class: Clean Contaminated

## 2011-12-15 MED ORDER — DEXAMETHASONE SODIUM PHOSPHATE 10 MG/ML IJ SOLN
INTRAMUSCULAR | Status: AC
  Filled 2011-12-15: qty 1

## 2011-12-15 MED ORDER — METHYLENE BLUE 1 % INJ SOLN
INTRAMUSCULAR | Status: AC
  Filled 2011-12-15: qty 1

## 2011-12-15 MED ORDER — KETOROLAC TROMETHAMINE 30 MG/ML IJ SOLN
INTRAMUSCULAR | Status: AC
  Filled 2011-12-15: qty 1

## 2011-12-15 MED ORDER — FENTANYL CITRATE 0.05 MG/ML IJ SOLN
INTRAMUSCULAR | Status: DC | PRN
  Administered 2011-12-15 (×2): 100 ug via INTRAVENOUS
  Administered 2011-12-15: 50 ug via INTRAVENOUS

## 2011-12-15 MED ORDER — FENTANYL CITRATE 0.05 MG/ML IJ SOLN
25.0000 ug | INTRAMUSCULAR | Status: DC | PRN
  Administered 2011-12-15 (×3): 50 ug via INTRAVENOUS

## 2011-12-15 MED ORDER — PROPOFOL 10 MG/ML IV EMUL
INTRAVENOUS | Status: DC | PRN
  Administered 2011-12-15: 100 mg via INTRAVENOUS
  Administered 2011-12-15: 180 mg via INTRAVENOUS
  Administered 2011-12-15: 20 mg via INTRAVENOUS

## 2011-12-15 MED ORDER — MIDAZOLAM HCL 5 MG/5ML IJ SOLN
INTRAMUSCULAR | Status: DC | PRN
  Administered 2011-12-15: 2 mg via INTRAVENOUS

## 2011-12-15 MED ORDER — PROPOFOL 10 MG/ML IV EMUL
INTRAVENOUS | Status: AC
  Filled 2011-12-15: qty 20

## 2011-12-15 MED ORDER — GLYCOPYRROLATE 0.2 MG/ML IJ SOLN
INTRAMUSCULAR | Status: AC
  Filled 2011-12-15: qty 1

## 2011-12-15 MED ORDER — GLYCOPYRROLATE 0.2 MG/ML IJ SOLN
INTRAMUSCULAR | Status: DC | PRN
  Administered 2011-12-15: 0.4 mg via INTRAVENOUS

## 2011-12-15 MED ORDER — NEOSTIGMINE METHYLSULFATE 1 MG/ML IJ SOLN
INTRAMUSCULAR | Status: AC
  Filled 2011-12-15: qty 10

## 2011-12-15 MED ORDER — LIDOCAINE HCL (CARDIAC) 20 MG/ML IV SOLN
INTRAVENOUS | Status: DC | PRN
  Administered 2011-12-15: 50 mg via INTRAVENOUS

## 2011-12-15 MED ORDER — ONDANSETRON HCL 4 MG/2ML IJ SOLN
INTRAMUSCULAR | Status: DC | PRN
  Administered 2011-12-15: 4 mg via INTRAVENOUS

## 2011-12-15 MED ORDER — OXYCODONE-ACETAMINOPHEN 5-325 MG PO TABS
1.0000 | ORAL_TABLET | ORAL | Status: DC | PRN
  Administered 2011-12-15: 1 via ORAL

## 2011-12-15 MED ORDER — ROCURONIUM BROMIDE 100 MG/10ML IV SOLN
INTRAVENOUS | Status: DC | PRN
  Administered 2011-12-15: 30 mg via INTRAVENOUS
  Administered 2011-12-15: 10 mg via INTRAVENOUS

## 2011-12-15 MED ORDER — FENTANYL CITRATE 0.05 MG/ML IJ SOLN
INTRAMUSCULAR | Status: AC
  Administered 2011-12-15: 50 ug via INTRAVENOUS
  Filled 2011-12-15: qty 2

## 2011-12-15 MED ORDER — ONDANSETRON HCL 4 MG/2ML IJ SOLN
INTRAMUSCULAR | Status: AC
  Filled 2011-12-15: qty 2

## 2011-12-15 MED ORDER — LIDOCAINE HCL (CARDIAC) 20 MG/ML IV SOLN
INTRAVENOUS | Status: AC
  Filled 2011-12-15: qty 5

## 2011-12-15 MED ORDER — ROCURONIUM BROMIDE 50 MG/5ML IV SOLN
INTRAVENOUS | Status: AC
  Filled 2011-12-15: qty 1

## 2011-12-15 MED ORDER — OXYCODONE-ACETAMINOPHEN 5-325 MG PO TABS: 1.0000 | ORAL_TABLET | ORAL | Status: DC | PRN

## 2011-12-15 MED ORDER — BUPIVACAINE HCL (PF) 0.25 % IJ SOLN
INTRAMUSCULAR | Status: AC
  Filled 2011-12-15: qty 30

## 2011-12-15 MED ORDER — MIDAZOLAM HCL 2 MG/2ML IJ SOLN
INTRAMUSCULAR | Status: AC
  Filled 2011-12-15: qty 2

## 2011-12-15 MED ORDER — OXYCODONE-ACETAMINOPHEN 5-325 MG PO TABS
ORAL_TABLET | ORAL | Status: AC
  Administered 2011-12-15: 1 via ORAL
  Filled 2011-12-15: qty 1

## 2011-12-15 MED ORDER — DEXAMETHASONE SODIUM PHOSPHATE 10 MG/ML IJ SOLN
INTRAMUSCULAR | Status: DC | PRN
  Administered 2011-12-15: 10 mg via INTRAVENOUS

## 2011-12-15 MED ORDER — LACTATED RINGERS IV SOLN
INTRAVENOUS | Status: DC
  Administered 2011-12-15 (×2): via INTRAVENOUS

## 2011-12-15 MED ORDER — BUPIVACAINE HCL (PF) 0.25 % IJ SOLN
INTRAMUSCULAR | Status: DC | PRN
  Administered 2011-12-15: 6 mL

## 2011-12-15 MED ORDER — NEOSTIGMINE METHYLSULFATE 1 MG/ML IJ SOLN
INTRAMUSCULAR | Status: DC | PRN
  Administered 2011-12-15: 2 mg via INTRAVENOUS

## 2011-12-15 MED ORDER — 0.9 % SODIUM CHLORIDE (POUR BTL) OPTIME
TOPICAL | Status: DC | PRN
  Administered 2011-12-15: 1000 mL

## 2011-12-15 MED ORDER — FENTANYL CITRATE 0.05 MG/ML IJ SOLN
INTRAMUSCULAR | Status: AC
  Filled 2011-12-15: qty 5

## 2011-12-15 MED ORDER — KETOROLAC TROMETHAMINE 30 MG/ML IJ SOLN
INTRAMUSCULAR | Status: DC | PRN
  Administered 2011-12-15: 30 mg via INTRAVENOUS

## 2011-12-15 MED ORDER — KETOROLAC TROMETHAMINE 30 MG/ML IJ SOLN: 15.0000 mg | Freq: Once | INTRAMUSCULAR | Status: DC | PRN

## 2011-12-15 SURGICAL SUPPLY — 29 items
BAG SPEC RTRVL LRG 6X4 10 (ENDOMECHANICALS)
BARRIER ADHS 3X4 INTERCEED (GAUZE/BANDAGES/DRESSINGS) IMPLANT
BLADE SURG 15 STRL LF C SS BP (BLADE) ×3 IMPLANT
BLADE SURG 15 STRL SS (BLADE) ×4
BRR ADH 4X3 ABS CNTRL BYND (GAUZE/BANDAGES/DRESSINGS)
CABLE HIGH FREQUENCY MONO STRZ (ELECTRODE) IMPLANT
CATH ROBINSON RED A/P 16FR (CATHETERS) ×4 IMPLANT
CLOTH BEACON ORANGE TIMEOUT ST (SAFETY) ×4 IMPLANT
GLOVE BIO SURGEON STRL SZ7.5 (GLOVE) ×8 IMPLANT
GOWN PREVENTION PLUS LG XLONG (DISPOSABLE) ×8 IMPLANT
NS IRRIG 1000ML POUR BTL (IV SOLUTION) ×4 IMPLANT
PACK LAPAROSCOPY BASIN (CUSTOM PROCEDURE TRAY) ×4 IMPLANT
POUCH SPECIMEN RETRIEVAL 10MM (ENDOMECHANICALS) IMPLANT
PROTECTOR NERVE ULNAR (MISCELLANEOUS) ×4 IMPLANT
RING FALLOPIAN BANDS (Ring) ×1 IMPLANT
SCALPEL HARMONIC ACE (MISCELLANEOUS) IMPLANT
SET IRRIG TUBING LAPAROSCOPIC (IRRIGATION / IRRIGATOR) IMPLANT
SLEEVE Z-THREAD 5X100MM (TROCAR) ×1 IMPLANT
SOLUTION ELECTROLUBE (MISCELLANEOUS) IMPLANT
SUT PLAIN 4 0 FS 2 27 (SUTURE) ×4 IMPLANT
SUT VICRYL 0 ENDOLOOP (SUTURE) IMPLANT
SUT VICRYL 0 UR6 27IN ABS (SUTURE) ×4 IMPLANT
TOWEL OR 17X24 6PK STRL BLUE (TOWEL DISPOSABLE) ×8 IMPLANT
TROCAR 12M 150ML BLUNT (TROCAR) ×1 IMPLANT
TROCAR 5M 150ML BLDLS (TROCAR) ×1 IMPLANT
TROCAR XCEL NON BLADE 8MM B8LT (ENDOMECHANICALS) ×1 IMPLANT
TROCAR XCEL NON-BLD 11X100MML (ENDOMECHANICALS) ×4 IMPLANT
TROCAR Z-THREAD FIOS 5X100MM (TROCAR) ×3 IMPLANT
WATER STERILE IRR 1000ML POUR (IV SOLUTION) ×3 IMPLANT

## 2011-12-15 NOTE — Anesthesia Postprocedure Evaluation (Signed)
Anesthesia Post Note  Patient: Angela Bender  Procedure(s) Performed: Procedure(s) (LRB): LAPAROSCOPY OPERATIVE (N/A) OVARIAN CYSTECTOMY (Right) LAPAROSCOPIC TUBAL LIGATION (Bilateral)  Anesthesia type: General  Patient location: PACU  Post pain: Pain level controlled  Post assessment: Post-op Vital signs reviewed  Last Vitals:  Filed Vitals:   12/15/11 1530  BP: 105/64  Pulse: 87  Temp: 36.7 C  Resp: 16    Post vital signs: Reviewed  Level of consciousness: sedated  Complications: No apparent anesthesia complications

## 2011-12-15 NOTE — Op Note (Signed)
Angela Bender 05-25-1970 [id number]   Post Operative Note   Date of surgery:  12/15/2011  Pre Op Dx:  Desires permanent sterilization, pelvic pain, right ovarian cyst  Post Op Dx:  same  Procedure:  Laparoscopic bilateral tubal sterilization, Falope ring technique. Right ovarian cystectomy.  Surgeon:  Dara Lords  Assistant:  Reynaldo Minium  Anesthesia:  General  EBL:  minimal  Complications:  None  Specimen:  Right ovarian cyst wall biopsy to pathology  Findings: EUA:   External BUS vagina normal.cervix normal. Uterus anteverted normal size midline mobile. Adnexa without masses   Operative:  Anterior cul-de-sac normal. Posterior cul-de-sac normal. Uterus normal size shape and contour. Right and left fallopian tubes normal length caliber fimbriated ends. Left ovary grossly normal free and mobile. Right ovary with small simple physiologic appearing cyst, entered, biopsied and fulgurated.  No evidence of pelvic adhesions or endometriosis. Upper abdominal exam shows liver smooth. Gallbladder/appendix not visualized.  Procedure:  The patient was taken to the operating room, underwent general anesthesia, was placed in the low dorsal lithotomy position, received an abdominal/perineal/vaginal preparation with Betadine solution, EUA performed and the bladder emptied with an in and out Foley catheterization.  A timeout was performed by the surgical team. The cervix was then visualized with a speculum and a Hulka tenaculum was placed for manipulation during the case.  The patient was draped in the usual fashion. A vertical infraumbilical incision was made and using the 10 mm Optiview trocar and initial attempts at entry into the abdomen were unsuccessful due to the adipose depth and subsequently using curved retractors the abdomen was directly entered as an open laparoscopy and the Optiview was placed and secured within the incision. The abdomen was then insufflated and a right suprapubic  5 mm port was then placed under direct visualization after transillumination for the vessels without difficulty. Examination of the pelvic organs and upper abdominal exam was carried out with findings noted above. An 8 mm suprapubic midline trocar was then placed under direct visualization after transillumination for the vessels without difficulty and the right fallopian tube was identified traced from its insertion to the fimbriated end and a single Falope ring was applied to a mid tubal segment without difficulty noting a good knuckle of tube within the ring and manipulation assuring secure placement. A similar procedure was carried out on the other side. The right ovary was inspected and found to have a small physiologic-appearing cyst, but given the patient's pain, the ovary was stabilized and the cyst incised and noting to contain clear yellow fluid and a smooth simple cyst lining. Several small biopsies were taken of the cyst wall during an attempt to strip the cyst from the surrounding stroma but given the fragility of the cyst wall this was not possible. These biopsies were sent to pathology and subsequently all remaining cyst wall was fulgurated. The ovary was irrigated showing adequate hemostasis, the Falope-Rings were reinspected to assure secure proper placement, the suprapubic trochars removed under direct visualization showing adequate hemostasis and the infraumbilical port backed out under direct visualization assuring adequate hemostasis and no evidence of hernia formation. All skin incisions were injected using 0.25% Marcaine and the infraumbilical port closed using 0 Vicryl suture in a figure-of-eight fascial stitch. All skin incisions were closed using 3-0 plain suture in cuticular stitch, sterile dressings were applied, the patient received intraoperative Toradol, the Hulka tenaculum removed from the cervix, the patient placed in the supine position, awakened without difficulty and taken to the  recovery room in good condition having tolerated the procedure well.    Dara Lords MD, 4:54 PM 12/15/2011

## 2011-12-15 NOTE — Transfer of Care (Signed)
Immediate Anesthesia Transfer of Care Note  Patient: Angela Bender  Procedure(s) Performed: Procedure(s) (LRB) with comments: LAPAROSCOPY OPERATIVE (N/A) OVARIAN CYSTECTOMY (Right) LAPAROSCOPIC TUBAL LIGATION (Bilateral) - with falope rings  Patient Location: PACU  Anesthesia Type:General  Level of Consciousness: sedated  Airway & Oxygen Therapy: Patient Spontanous Breathing  Post-op Assessment: Report given to PACU RN  Post vital signs: Reviewed and stable  Complications: No apparent anesthesia complications

## 2011-12-15 NOTE — H&P (Signed)
  The patient was examined.  I reviewed the proposed surgery and consent form with the patient.  The dictated history and physical is current and accurate and all questions were answered. The patient is ready to proceed with surgery and has a realistic understanding and expectation for the outcome.   Dara Lords MD, 12:54 PM 12/15/2011

## 2011-12-15 NOTE — Anesthesia Preprocedure Evaluation (Addendum)
Anesthesia Evaluation  Patient identified by MRN, date of birth, ID band Patient awake    Reviewed: Allergy & Precautions, H&P , NPO status , Patient's Chart, lab work & pertinent test results, reviewed documented beta blocker date and time   History of Anesthesia Complications Negative for: history of anesthetic complications  Airway Mallampati: II TM Distance: >3 FB Neck ROM: full    Dental  (+) Teeth Intact   Pulmonary Current Smoker,  breath sounds clear to auscultation  Pulmonary exam normal       Cardiovascular Exercise Tolerance: Good negative cardio ROS  Rhythm:regular Rate:Normal     Neuro/Psych PSYCHIATRIC DISORDERS (depression) negative neurological ROS     GI/Hepatic Neg liver ROS, hiatal hernia, GERD-  ,  Endo/Other  negative endocrine ROS  Renal/GU negative Renal ROS  Female GU complaint     Musculoskeletal   Abdominal   Peds  Hematology negative hematology ROS (+)   Anesthesia Other Findings   Reproductive/Obstetrics negative OB ROS                          Anesthesia Physical Anesthesia Plan  ASA: II  Anesthesia Plan: General ETT   Post-op Pain Management:    Induction:   Airway Management Planned:   Additional Equipment:   Intra-op Plan:   Post-operative Plan:   Informed Consent: I have reviewed the patients History and Physical, chart, labs and discussed the procedure including the risks, benefits and alternatives for the proposed anesthesia with the patient or authorized representative who has indicated his/her understanding and acceptance.   Dental Advisory Given  Plan Discussed with: CRNA and Surgeon  Anesthesia Plan Comments:         Anesthesia Quick Evaluation

## 2011-12-16 ENCOUNTER — Encounter (HOSPITAL_COMMUNITY): Payer: Self-pay | Admitting: Gynecology

## 2011-12-29 ENCOUNTER — Ambulatory Visit: Payer: 59 | Admitting: Gynecology

## 2012-01-02 ENCOUNTER — Encounter: Payer: Self-pay | Admitting: Gynecology

## 2012-01-02 ENCOUNTER — Ambulatory Visit (INDEPENDENT_AMBULATORY_CARE_PROVIDER_SITE_OTHER): Payer: 59 | Admitting: Gynecology

## 2012-01-02 DIAGNOSIS — Z9889 Other specified postprocedural states: Secondary | ICD-10-CM

## 2012-01-02 NOTE — Patient Instructions (Signed)
Follow up June/July 2014 for annual exam, sooner if any issues

## 2012-01-02 NOTE — Progress Notes (Signed)
Patient presents postoperative from laparoscopic tubal sterilization, Falope ring technique and right ovarian cystectomy which shows a benign cyst on pathology.  She's done well post operatively without complaints. She's had intercourse without issues.  Exam with [name] assistant Abdomen soft nontender without masses guarding rebound organomegaly. Incisions healed nicely.  Assessment and plan: Postop checkup status post laparoscopic tubal sterilization right ovarian cystectomy, doing well. Pathology shows benign cyst. Patient has resumed all normal activities and doing well with these. We'll follow up in the spring/summer when she is due for her annual, sooner as needed.

## 2012-03-31 ENCOUNTER — Other Ambulatory Visit: Payer: Self-pay

## 2012-12-20 ENCOUNTER — Other Ambulatory Visit: Payer: Self-pay

## 2013-03-18 ENCOUNTER — Other Ambulatory Visit: Payer: Self-pay | Admitting: Dermatology

## 2013-07-01 IMAGING — CR DG CHEST 2V
2 series · 2 of 2 positions shown · non-contrast
Comparison: None.

CLINICAL DATA: Cough.

CHEST - 2 VIEW

[PA]
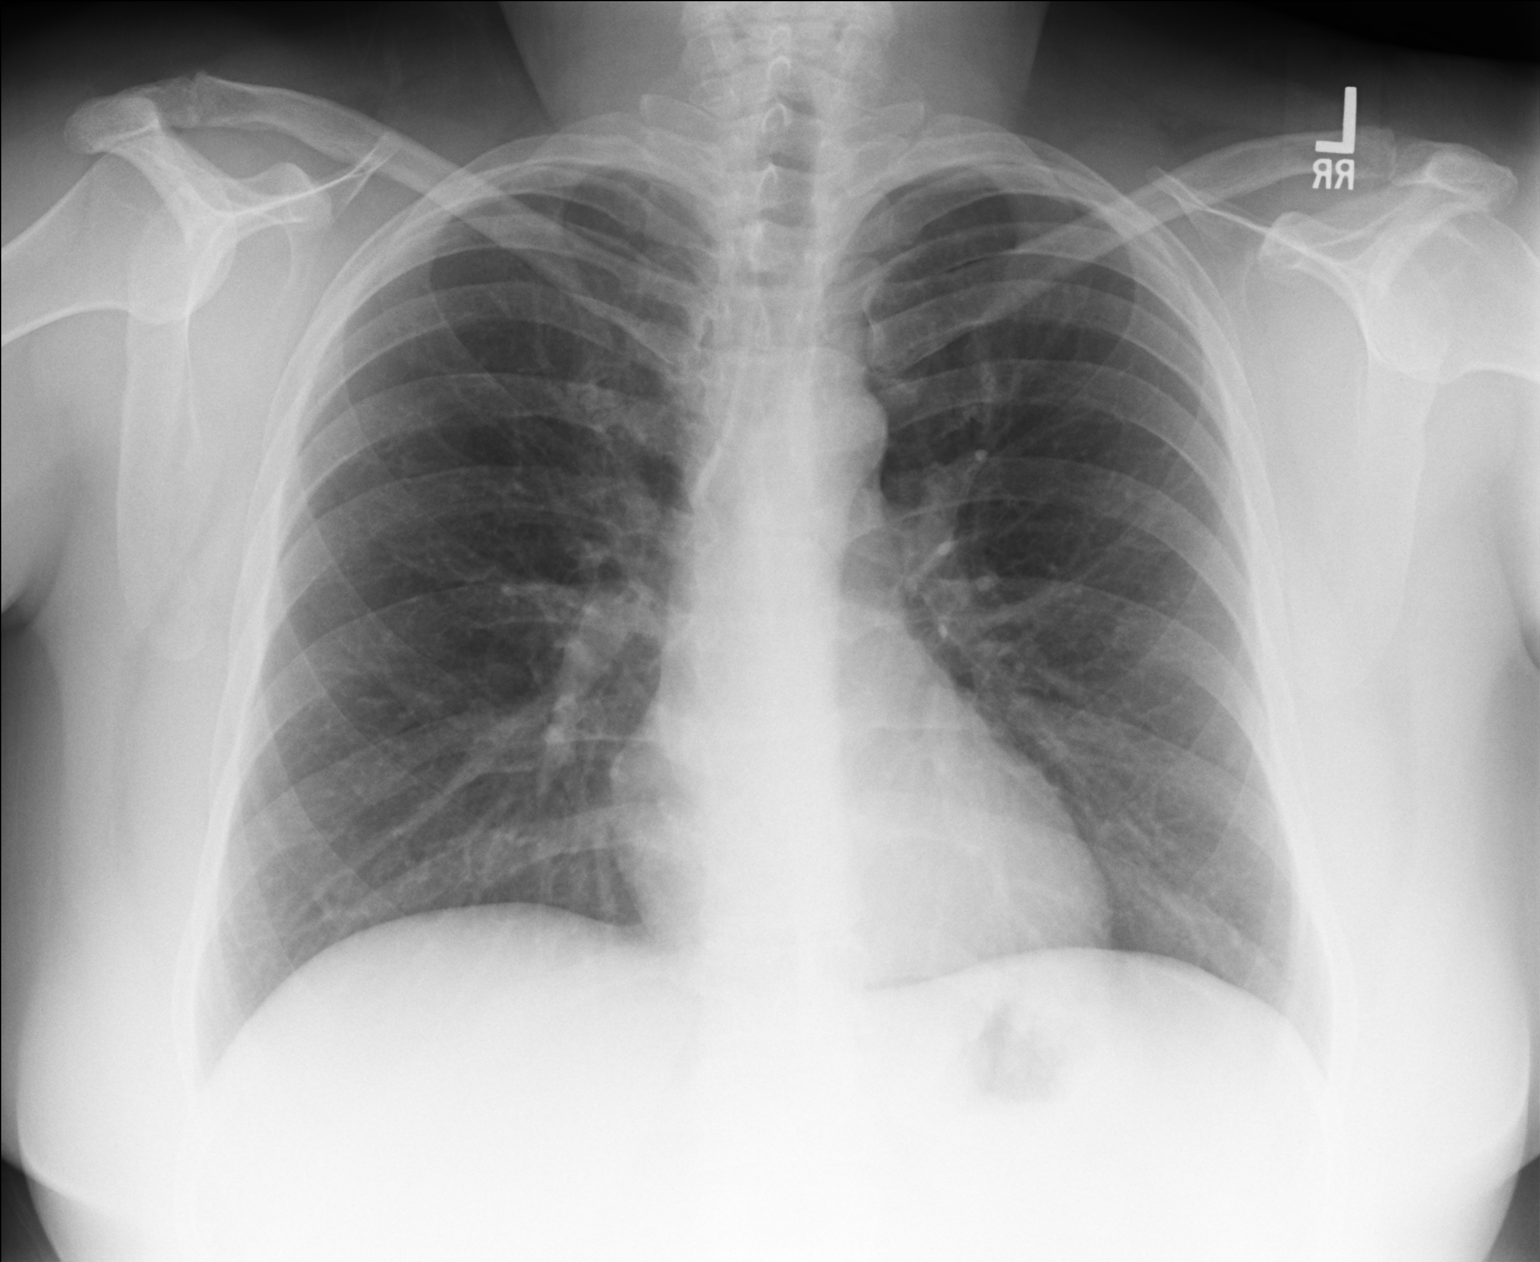

[lateral]
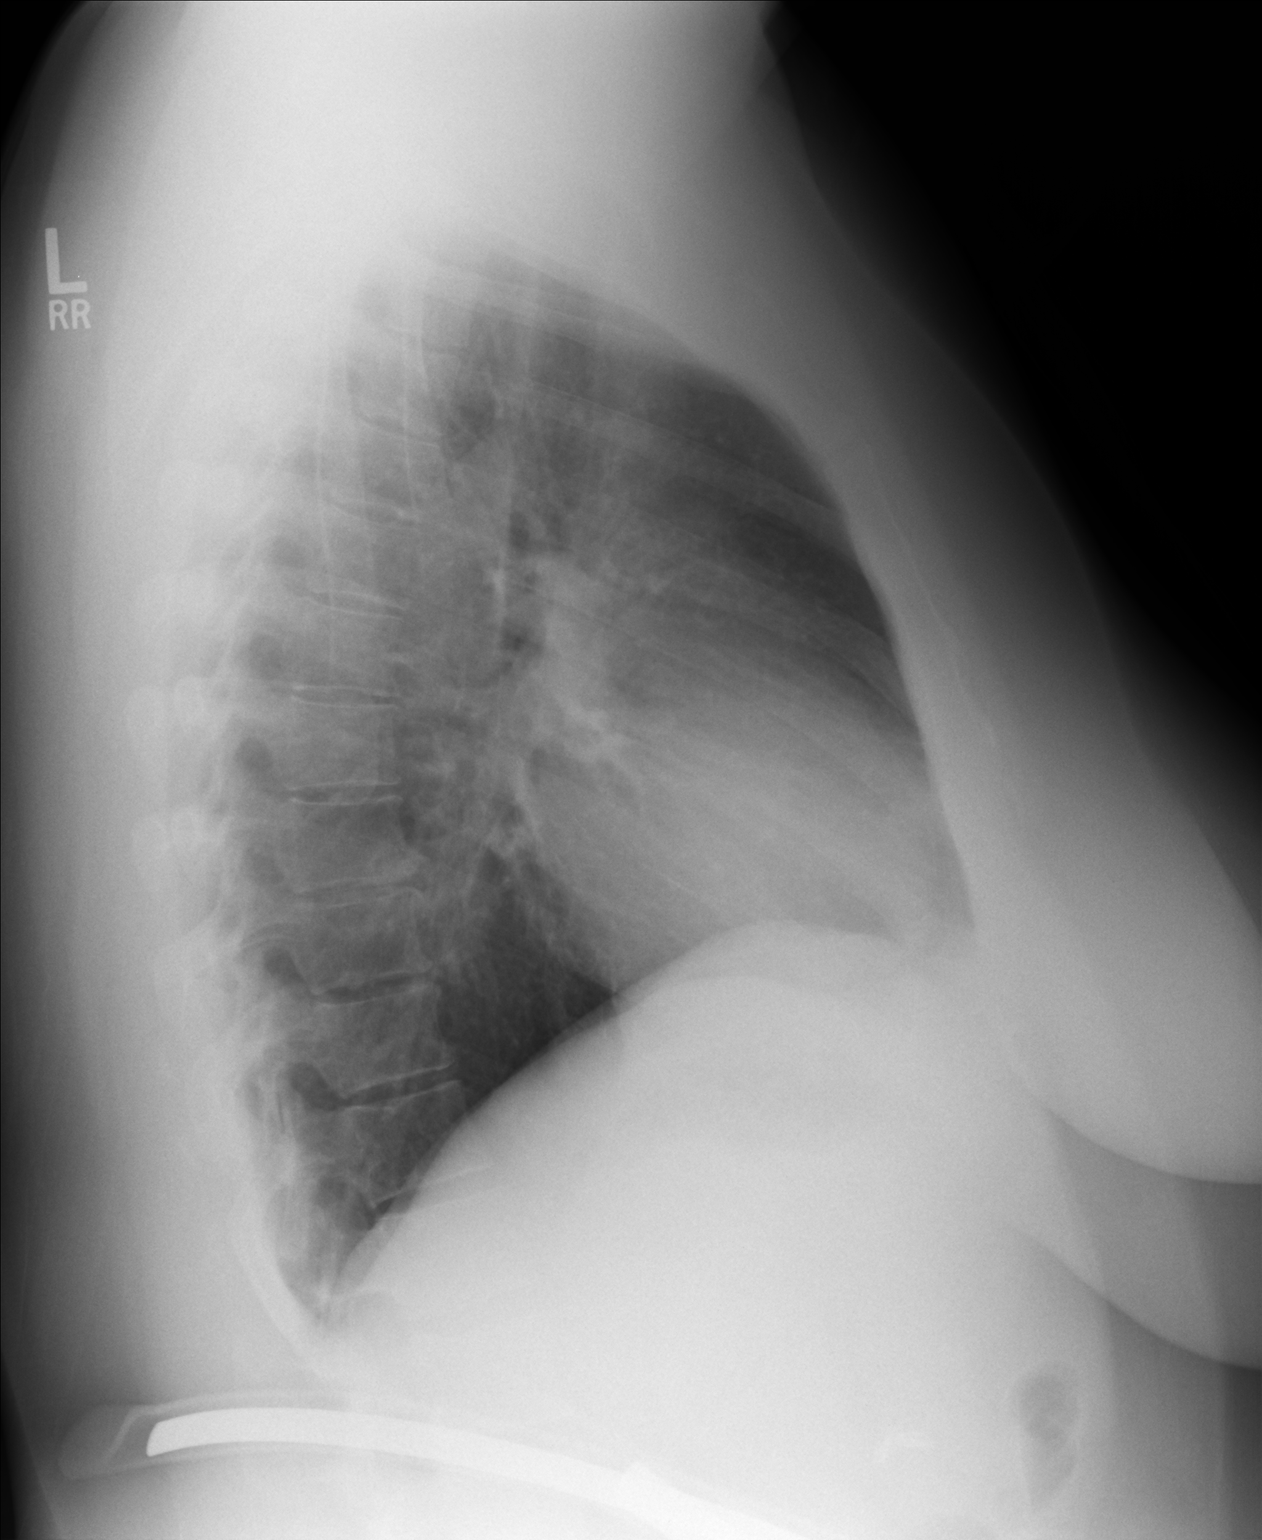

[2 of 2 positions shown; findings below may reference images not displayed]

FINDINGS: Heart and mediastinal contours are within normal limits.
No focal opacities or effusions.  No acute bony abnormality.
IMPRESSION: No active cardiopulmonary disease.

## 2013-10-01 ENCOUNTER — Telehealth: Payer: Self-pay

## 2013-10-01 NOTE — Telephone Encounter (Signed)
Telephone call, has separated from her husband, now living in an apartment, husband is making separation/impending divorce difficult. 43 year old daughter Angela Bender had regular monthly cycles until January at the time of separation is now  amenorrheic, not sexually active. Reviewed office visit best.

## 2013-10-01 NOTE — Telephone Encounter (Signed)
Would like to talk with you about her 43 year old daughter, Angela Bender, and whether you think office visit indicated.

## 2013-12-16 ENCOUNTER — Encounter: Payer: Self-pay | Admitting: Gynecology

## 2014-12-03 ENCOUNTER — Telehealth: Payer: Self-pay

## 2014-12-03 NOTE — Telephone Encounter (Signed)
Patient called in voice mail at 4:17pm.  She stated she has not had a period in 9 mos and day before yesterday began to bleed. She said when she got up this morning it was extremely heavy and has been all day. She took daughter to visit a college this afternoon and put in a tampon and put on  2 pads and 3 hours later when she got home the pads and tampon were saturated. I called her and discussed with her that we have not seen her in 3 years so technically will be a new patient. We have openings tomorrow with [facility] and can get her in for a visit however, if she feels her bleeding needs assessing prior to that [facility]. She wants to schedule appt tomorrow and again I stressed to her that if she feels bleeding needs to be assessed prior to visit [facility].

## 2014-12-04 ENCOUNTER — Ambulatory Visit (INDEPENDENT_AMBULATORY_CARE_PROVIDER_SITE_OTHER): Payer: Commercial Managed Care - HMO | Admitting: Women's Health

## 2014-12-04 ENCOUNTER — Encounter: Payer: Self-pay | Admitting: Women's Health

## 2014-12-04 VITALS — BP 140/80 | Ht 64.0 in | Wt 209.0 lb

## 2014-12-04 DIAGNOSIS — N921 Excessive and frequent menstruation with irregular cycle: Secondary | ICD-10-CM

## 2014-12-04 DIAGNOSIS — N83201 Unspecified ovarian cyst, right side: Secondary | ICD-10-CM

## 2014-12-04 NOTE — Progress Notes (Signed)
Patient ID: Angela Bender, female   DOB: 07/07/1970, 44 y.o.   MRN: [id number] Presents with complaint of heavy menstrual cycle. Cycles started 1 week ago light flow, for the last 2 days changing tampon and pad every 1-2 hours around-the-clock. States has bled through clothing, passed large clots. Today bleeding has slowed. History of a BTL 2013, cycles have been irregular this past year, amenorrheic for 8 months until last week. Had increased hot flushes over the past few months, none now. Denies discharge, abdominal pain, urinary symptoms.  Exam: Appears well. External genitalia within normal limits, speculum exam small amount of menses type blood present without odor, cervix clean without visible lesion or polyp.  Perimenopausal/heavy cycle x 1/BTL  Plan: Reviewed appears bleeding has stopped, will watch at this time. Reviewed if amenorrheic for greater than 2 months instructed to call for blood work and possible medication to start cycle. Overdue for annual instructed to keep scheduled appointment

## 2014-12-04 NOTE — Telephone Encounter (Signed)
Error/already had encounter opened.

## 2014-12-04 NOTE — Patient Instructions (Signed)

## 2014-12-25 ENCOUNTER — Encounter: Payer: Self-pay | Admitting: Women's Health

## 2015-02-06 ENCOUNTER — Encounter: Payer: Self-pay | Admitting: Women's Health

## 2015-02-06 ENCOUNTER — Ambulatory Visit (INDEPENDENT_AMBULATORY_CARE_PROVIDER_SITE_OTHER): Payer: Commercial Managed Care - HMO | Admitting: Women's Health

## 2015-02-06 ENCOUNTER — Other Ambulatory Visit (HOSPITAL_COMMUNITY)
Admission: RE | Admit: 2015-02-06 | Discharge: 2015-02-06 | Disposition: A | Discharge status: Home / Self Care | Payer: BC Managed Care – PPO | Source: Ambulatory Visit | Attending: Women's Health | Admitting: Women's Health

## 2015-02-06 VITALS — BP 124/72 | Ht 65.0 in | Wt 201.0 lb

## 2015-02-06 DIAGNOSIS — Z1329 Encounter for screening for other suspected endocrine disorder: Secondary | ICD-10-CM

## 2015-02-06 DIAGNOSIS — Z13220 Encounter for screening for lipoid disorders: Secondary | ICD-10-CM | POA: Diagnosis not present

## 2015-02-06 DIAGNOSIS — Z9851 Tubal ligation status: Secondary | ICD-10-CM | POA: Diagnosis not present

## 2015-02-06 DIAGNOSIS — Z113 Encounter for screening for infections with a predominantly sexual mode of transmission: Secondary | ICD-10-CM

## 2015-02-06 DIAGNOSIS — Z1151 Encounter for screening for human papillomavirus (HPV): Secondary | ICD-10-CM | POA: Diagnosis present

## 2015-02-06 DIAGNOSIS — Z01419 Encounter for gynecological examination (general) (routine) without abnormal findings: Secondary | ICD-10-CM

## 2015-02-06 LAB — CBC WITH DIFFERENTIAL/PLATELET
BASOS PCT: 0 % (ref 0–1)
Basophils Absolute: 0 10*3/uL (ref 0.0–0.1)
Eosinophils Absolute: 0.1 10*3/uL (ref 0.0–0.7)
Eosinophils Relative: 2 % (ref 0–5)
HEMATOCRIT: 42.1 % (ref 36.0–46.0)
HEMOGLOBIN: 14.4 g/dL (ref 12.0–15.0)
LYMPHS PCT: 28 % (ref 12–46)
Lymphs Abs: 2 10*3/uL (ref 0.7–4.0)
MCH: 30.8 pg (ref 26.0–34.0)
MCHC: 34.2 g/dL (ref 30.0–36.0)
MCV: 90 fL (ref 78.0–100.0)
MONO ABS: 0.5 10*3/uL (ref 0.1–1.0)
MPV: 9.3 fL (ref 8.6–12.4)
Monocytes Relative: 7 % (ref 3–12)
NEUTROS ABS: 4.5 10*3/uL (ref 1.7–7.7)
NEUTROS PCT: 63 % (ref 43–77)
Platelets: 329 10*3/uL (ref 150–400)
RBC: 4.68 MIL/uL (ref 3.87–5.11)
RDW: 14 % (ref 11.5–15.5)
WBC: 7.2 10*3/uL (ref 4.0–10.5)

## 2015-02-06 LAB — LIPID PANEL
CHOLESTEROL: 228 mg/dL — AB (ref 125–200)
HDL: 45 mg/dL — AB (ref >=46)
LDL Cholesterol: 129 mg/dL (ref <130)
TRIGLYCERIDES: 269 mg/dL — AB (ref <150)
Total CHOL/HDL Ratio: 5.1 Ratio — ABNORMAL HIGH (ref <=5.0)
VLDL: 54 mg/dL — ABNORMAL HIGH (ref <30)

## 2015-02-06 LAB — COMPREHENSIVE METABOLIC PANEL
ALBUMIN: 4.3 g/dL (ref 3.6–5.1)
ALK PHOS: 75 U/L (ref 33–115)
ALT: 13 U/L (ref 6–29)
AST: 13 U/L (ref 10–30)
BILIRUBIN TOTAL: 0.4 mg/dL (ref 0.2–1.2)
BUN: 15 mg/dL (ref 7–25)
CALCIUM: 9.5 mg/dL (ref 8.6–10.2)
CO2: 26 mmol/L (ref 20–31)
CREATININE: 0.74 mg/dL (ref 0.50–1.10)
Chloride: 104 mmol/L (ref 98–110)
Glucose, Bld: 81 mg/dL (ref 65–99)
Potassium: 4.3 mmol/L (ref 3.5–5.3)
SODIUM: 142 mmol/L (ref 135–146)
TOTAL PROTEIN: 6.6 g/dL (ref 6.1–8.1)

## 2015-02-06 LAB — TSH: TSH: 0.973 u[IU]/mL (ref 0.350–4.500)

## 2015-02-06 NOTE — Patient Instructions (Signed)
DASH Eating Plan DASH stands for "Dietary Approaches to Stop Hypertension." The DASH eating plan is a healthy eating plan that has been shown to reduce high blood pressure (hypertension). Additional health benefits may include reducing the risk of type 2 diabetes mellitus, heart disease, and stroke. The DASH eating plan may also help with weight loss. WHAT DO I NEED TO KNOW ABOUT THE DASH EATING PLAN? For the DASH eating plan, you will follow these general guidelines:  Choose foods with a percent daily value for sodium of less than 5% (as listed on the food label).  Use salt-free seasonings or herbs instead of table salt or sea salt.  Check with your health care provider or pharmacist before using salt substitutes.  Eat lower-sodium products, often labeled as "lower sodium" or "no salt added."  Eat fresh foods.  Eat more vegetables, fruits, and low-fat dairy products.  Choose whole grains. Look for the word "whole" as the first word in the ingredient list.  Choose fish and skinless chicken or [place] more often than red meat. Limit fish, poultry, and meat to 6 oz (170 g) each day.  Limit sweets, desserts, sugars, and sugary drinks.  Choose heart-healthy fats.  Limit cheese to 1 oz (28 g) per day.  Eat more home-cooked food and less restaurant, buffet, and fast food.  Limit fried foods.  Cook foods using methods other than frying.  Limit canned vegetables. If you do use them, rinse them well to decrease the sodium.  When eating at a restaurant, ask that your food be prepared with less salt, or no salt if possible. WHAT FOODS CAN I EAT? Seek help from a dietitian for individual calorie needs. Grains Whole grain or whole wheat bread. Brown rice. Whole grain or whole wheat pasta. Quinoa, bulgur, and whole grain cereals. Low-sodium cereals. Corn or whole wheat flour tortillas. Whole grain cornbread. Whole grain crackers. Low-sodium crackers. Vegetables Fresh or frozen vegetables  (raw, steamed, roasted, or grilled). Low-sodium or reduced-sodium tomato and vegetable juices. Low-sodium or reduced-sodium tomato sauce and paste. Low-sodium or reduced-sodium canned vegetables.  Fruits All fresh, canned (in natural juice), or frozen fruits. Meat and Other Protein Products Ground beef (85% or leaner), grass-fed beef, or beef trimmed of fat. Skinless chicken or [place]. Ground chicken or [place]. Pork trimmed of fat. All fish and seafood. Eggs. Dried beans, peas, or lentils. Unsalted nuts and seeds. Unsalted canned beans. Dairy Low-fat dairy products, such as skim or 1% milk, 2% or reduced-fat cheeses, low-fat ricotta or cottage cheese, or plain low-fat yogurt. Low-sodium or reduced-sodium cheeses. Fats and Oils Tub margarines without trans fats. Light or reduced-fat mayonnaise and salad dressings (reduced sodium). Avocado. Safflower, olive, or canola oils. Natural peanut or almond butter. Other Unsalted popcorn and pretzels. The items listed above may not be a complete list of recommended foods or beverages. Contact your dietitian for more options. WHAT FOODS ARE NOT RECOMMENDED? Grains White bread. White pasta. White rice. Refined cornbread. Bagels and croissants. Crackers that contain trans fat. Vegetables Creamed or fried vegetables. Vegetables in a cheese sauce. Regular canned vegetables. Regular canned tomato sauce and paste. Regular tomato and vegetable juices. Fruits Dried fruits. Canned fruit in light or heavy syrup. Fruit juice. Meat and Other Protein Products Fatty cuts of meat. Ribs, chicken wings, bacon, sausage, bologna, salami, chitterlings, fatback, hot dogs, bratwurst, and packaged luncheon meats. Salted nuts and seeds. Canned beans with salt. Dairy Whole or 2% milk, cream, half-and-half, and cream cheese. Whole-fat or sweetened yogurt. Full-fat  cheeses or blue cheese. Nondairy creamers and whipped toppings. Processed cheese, cheese spreads, or cheese  curds. Condiments Onion and garlic salt, seasoned salt, table salt, and sea salt. Canned and packaged gravies. Worcestershire sauce. Tartar sauce. Barbecue sauce. Teriyaki sauce. Soy sauce, including reduced sodium. Steak sauce. Fish sauce. Oyster sauce. Cocktail sauce. Horseradish. Ketchup and mustard. Meat flavorings and tenderizers. Bouillon cubes. Hot sauce. Tabasco sauce. Marinades. Taco seasonings. Relishes. Fats and Oils Butter, stick margarine, lard, shortening, ghee, and bacon fat. Coconut, palm kernel, or palm oils. Regular salad dressings. Other Pickles and olives. Salted popcorn and pretzels. The items listed above may not be a complete list of foods and beverages to avoid. Contact your dietitian for more information. WHERE CAN I FIND MORE INFORMATION? National Heart, Lung, and Blood Institute: travelstabloid.com   This information is not intended to replace advice given to you by your health care provider. Make sure you discuss any questions you have with your health care provider.   Document Released: 01/20/2011 Document Revised: 02/21/2014 Document Reviewed: 12/05/2012 Elsevier Interactive Patient Education 2016 [place] Maintenance, Female Adopting a healthy lifestyle and getting preventive care can go a long way to promote health and wellness. Talk with your health care provider about what schedule of regular examinations is right for you. This is a good chance for you to check in with your provider about disease prevention and staying healthy. In between checkups, there are plenty of things you can do on your own. Experts have done a lot of research about which lifestyle changes and preventive measures are most likely to keep you healthy. Ask your health care provider for more information. WEIGHT AND DIET  Eat a healthy diet  Be sure to include plenty of vegetables, fruits, low-fat dairy products, and lean protein.  Do not eat a  lot of foods high in solid fats, added sugars, or salt.  Get regular exercise. This is one of the most important things you can do for your health.  Most adults should exercise for at least 150 minutes each week. The exercise should increase your heart rate and make you sweat (moderate-intensity exercise).  Most adults should also do strengthening exercises at least twice a week. This is in addition to the moderate-intensity exercise.  Maintain a healthy weight  Body mass index (BMI) is a measurement that can be used to identify possible weight problems. It estimates body fat based on height and weight. Your health care provider can help determine your BMI and help you achieve or maintain a healthy weight.  For females 64 years of age and older:   A BMI below 18.5 is considered underweight.  A BMI of 18.5 to 24.9 is normal.  A BMI of 25 to 29.9 is considered overweight.  A BMI of 30 and above is considered obese.  Watch levels of cholesterol and blood lipids  You should start having your blood tested for lipids and cholesterol at 44 years of age, then have this test every 5 years.  You may need to have your cholesterol levels checked more often if:  Your lipid or cholesterol levels are high.  You are older than 44 years of age.  You are at high risk for heart disease.  CANCER SCREENING   Lung Cancer  Lung cancer screening is recommended for adults 26-25 years old who are at high risk for lung cancer because of a history of smoking.  A yearly low-dose CT scan of the lungs is recommended for people  who:  Currently smoke.  Have quit within the past 15 years.  Have at least a 30-pack-year history of smoking. A pack year is smoking an average of one pack of cigarettes a day for 1 year.  Yearly screening should continue until it has been 15 years since you quit.  Yearly screening should stop if you develop a health problem that would prevent you from having lung cancer  treatment.  Breast Cancer  Practice breast self-awareness. This means understanding how your breasts normally appear and feel.  It also means doing regular breast self-exams. Let your health care provider know about any changes, no matter how small.  If you are in your 20s or 30s, you should have a clinical breast exam (CBE) by a health care provider every 1-3 years as part of a regular health exam.  If you are 67 or older, have a CBE every year. Also consider having a breast X-ray (mammogram) every year.  If you have a family history of breast cancer, talk to your health care provider about genetic screening.  If you are at high risk for breast cancer, talk to your health care provider about having an MRI and a mammogram every year.  Breast cancer gene (BRCA) assessment is recommended for women who have family members with BRCA-related cancers. BRCA-related cancers include:  Breast.  Ovarian.  Tubal.  Peritoneal cancers.  Results of the assessment will determine the need for genetic counseling and BRCA1 and BRCA2 testing. Cervical Cancer Your health care provider may recommend that you be screened regularly for cancer of the pelvic organs (ovaries, uterus, and vagina). This screening involves a pelvic examination, including checking for microscopic changes to the surface of your cervix (Pap test). You may be encouraged to have this screening done every 3 years, beginning at age 19.  For women ages 19-65, health care providers may recommend pelvic exams and Pap testing every 3 years, or they may recommend the Pap and pelvic exam, combined with testing for human papilloma virus (HPV), every 5 years. Some types of HPV increase your risk of cervical cancer. Testing for HPV may also be done on women of any age with unclear Pap test results.  Other health care providers may not recommend any screening for nonpregnant women who are considered low risk for pelvic cancer and who do not have  symptoms. Ask your health care provider if a screening pelvic exam is right for you.  If you have had past treatment for cervical cancer or a condition that could lead to cancer, you need Pap tests and screening for cancer for at least 20 years after your treatment. If Pap tests have been discontinued, your risk factors (such as having a new sexual partner) need to be reassessed to determine if screening should resume. Some women have medical problems that increase the chance of getting cervical cancer. In these cases, your health care provider may recommend more frequent screening and Pap tests. Colorectal Cancer  This type of cancer can be detected and often prevented.  Routine colorectal cancer screening usually begins at 44 years of age and continues through 44 years of age.  Your health care provider may recommend screening at an earlier age if you have risk factors for colon cancer.  Your health care provider may also recommend using home test kits to check for hidden blood in the stool.  A small camera at the end of a tube can be used to examine your colon directly (sigmoidoscopy or colonoscopy).  This is done to check for the earliest forms of colorectal cancer.  Routine screening usually begins at age 2.  Direct examination of the colon should be repeated every 5-10 years through 44 years of age. However, you may need to be screened more often if early forms of precancerous polyps or small growths are found. Skin Cancer  Check your skin from head to toe regularly.  Tell your health care provider about any new moles or changes in moles, especially if there is a change in a mole's shape or color.  Also tell your health care provider if you have a mole that is larger than the size of a pencil eraser.  Always use sunscreen. Apply sunscreen liberally and repeatedly throughout the day.  Protect yourself by wearing long sleeves, pants, a wide-brimmed hat, and sunglasses whenever you are  outside. HEART DISEASE, DIABETES, AND HIGH BLOOD PRESSURE   High blood pressure causes heart disease and increases the risk of stroke. High blood pressure is more likely to develop in:  People who have blood pressure in the high end of the normal range (130-139/85-89 mm Hg).  People who are overweight or obese.  People who are African American.  If you are 64-51 years of age, have your blood pressure checked every 3-5 years. If you are 76 years of age or older, have your blood pressure checked every year. You should have your blood pressure measured twice--once when you are at a hospital or clinic, and once when you are not at a hospital or clinic. Record the average of the two measurements. To check your blood pressure when you are not at a hospital or clinic, you can use:  An automated blood pressure machine at a pharmacy.  A home blood pressure monitor.  If you are between 42 years and 25 years old, ask your health care provider if you should take aspirin to prevent strokes.  Have regular diabetes screenings. This involves taking a blood sample to check your fasting blood sugar level.  If you are at a normal weight and have a low risk for diabetes, have this test once every three years after 44 years of age.  If you are overweight and have a high risk for diabetes, consider being tested at a younger age or more often. PREVENTING INFECTION  Hepatitis B  If you have a higher risk for hepatitis B, you should be screened for this virus. You are considered at high risk for hepatitis B if:  You were born in a country where hepatitis B is common. Ask your health care provider which countries are considered high risk.  Your parents were born in a high-risk country, and you have not been immunized against hepatitis B (hepatitis B vaccine).  You have HIV or AIDS.  You use needles to inject street drugs.  You live with someone who has hepatitis B.  You have had sex with someone who has  hepatitis B.  You get hemodialysis treatment.  You take certain medicines for conditions, including cancer, organ transplantation, and autoimmune conditions. Hepatitis C  Blood testing is recommended for:  Everyone born from [birth year] through 1965.  Anyone with known risk factors for hepatitis C. Sexually transmitted infections (STIs)  You should be screened for sexually transmitted infections (STIs) including gonorrhea and chlamydia if:  You are sexually active and are younger than 44 years of age.  You are older than 44 years of age and your health care provider tells you that you are  at risk for this type of infection.  Your sexual activity has changed since you were last screened and you are at an increased risk for chlamydia or gonorrhea. Ask your health care provider if you are at risk.  If you do not have HIV, but are at risk, it may be recommended that you take a prescription medicine daily to prevent HIV infection. This is called pre-exposure prophylaxis (PrEP). You are considered at risk if:  You are sexually active and do not regularly use condoms or know the HIV status of your partner(s).  You take drugs by injection.  You are sexually active with a partner who has HIV. Talk with your health care provider about whether you are at high risk of being infected with HIV. If you choose to begin PrEP, you should first be tested for HIV. You should then be tested every 3 months for as long as you are taking PrEP.  PREGNANCY   If you are premenopausal and you may become pregnant, ask your health care provider about preconception counseling.  If you may become pregnant, take 400 to 800 micrograms (mcg) of folic acid every day.  If you want to prevent pregnancy, talk to your health care provider about birth control (contraception). OSTEOPOROSIS AND MENOPAUSE   Osteoporosis is a disease in which the bones lose minerals and strength with aging. This can result in serious bone  fractures. Your risk for osteoporosis can be identified using a bone density scan.  If you are 55 years of age or older, or if you are at risk for osteoporosis and fractures, ask your health care provider if you should be screened.  Ask your health care provider whether you should take a calcium or vitamin D supplement to lower your risk for osteoporosis.  Menopause may have certain physical symptoms and risks.  Hormone replacement therapy may reduce some of these symptoms and risks. Talk to your health care provider about whether hormone replacement therapy is right for you.  HOME CARE INSTRUCTIONS   Schedule regular health, dental, and eye exams.  Stay current with your immunizations.   Do not use any tobacco products including cigarettes, chewing tobacco, or electronic cigarettes.  If you are pregnant, do not drink alcohol.  If you are breastfeeding, limit how much and how often you drink alcohol.  Limit alcohol intake to no more than 1 drink per day for nonpregnant women. One drink equals 12 ounces of beer, 5 ounces of wine, or 1 ounces of hard liquor.  Do not use street drugs.  Do not share needles.  Ask your health care provider for help if you need support or information about quitting drugs.  Tell your health care provider if you often feel depressed.  Tell your health care provider if you have ever been abused or do not feel safe at home.   This information is not intended to replace advice given to you by your health care provider. Make sure you discuss any questions you have with your health care provider.   Document Released: 08/16/2010 Document Revised: 02/21/2014 Document Reviewed: 01/02/2013 Elsevier Interactive Patient Education [employer].

## 2015-02-06 NOTE — Progress Notes (Signed)
Angela Bender 11/08/1970 [id number]    History:    Presents for annual exam.  2013 BTL irregular cycles past year, lately  monthly cycles. Normal Pap history. Has not had a mammogram. Reports both sister and mother became menopausal in her early 7440s. Recent separation from husband, and eyes infidelity. History of depression. Smoker.  Past medical history, past surgical history, family history and social history were all reviewed and documented in the EPIC chart. Works for the [employer]. Mother hypertension.  ROS:  A ROS was performed and pertinent positives and negatives are included.  Exam:  Filed Vitals:   02/06/15 1129  BP: 124/72    General appearance:  Normal Thyroid:  Symmetrical, normal in size, without palpable masses or nodularity. Respiratory  Auscultation:  Clear without wheezing or rhonchi Cardiovascular  Auscultation:  Regular rate, without rubs, murmurs or gallops  Edema/varicosities:  Not grossly evident Abdominal  Soft,nontender, without masses, guarding or rebound.  Liver/spleen:  No organomegaly noted  Hernia:  None appreciated  Skin  Inspection:  Grossly normal   Breasts: Examined lying and sitting.     Right: Without masses, retractions, discharge or axillary adenopathy.     Left: Without masses, retractions, discharge or axillary adenopathy. Gentitourinary   Inguinal/mons:  Normal without inguinal adenopathy  External genitalia:  Normal  BUS/Urethra/Skene's glands:  Normal  Vagina:  Normal  Cervix:  Normal  Uterus:  normal in size, shape and contour.  Midline and mobile  Adnexa/parametria:     Rt: Without masses or tenderness.   Lt: Without masses or tenderness.  Anus and perineum: Normal  Digital rectal exam: Normal sphincter tone without palpated masses or tenderness  Assessment/Plan:  44 y.o. separated WF G2 P2 for annual exam.   Irregular cycles every 1-6 months/BTL STD screen History of hypertension currently off medication Situational stress  marital separation Anxiety/depression-psychiatrist manages meds Smoker  Plan: SBE's, reviewed importance of annual screening mammogram, breast center information given and reviewed instructed to schedule. Exercise, calcium rich diet, vitamin D 1000 daily encouraged. Condoms encouraged if sexually active. CBC, CMP, lipid panel, UA, Pap with HR HPV typing, GC/Chlamydia, HIV, hep B, C, RPR. Instructed to print off lab results and follow-up with primary care for possible continuation of antihypertensive. Aware of hazards of smoking, ways to decrease discussed. Continue care with psychiatrist for depression. Counseling encouraged.   Harrington ChallengerYOUNG,NANCY [facility] CenterWHNP, 12:41 PM 02/06/2015

## 2015-02-07 LAB — URINALYSIS W MICROSCOPIC + REFLEX CULTURE
BACTERIA UA: NONE SEEN [HPF]
Bilirubin Urine: NEGATIVE
CASTS: NONE SEEN [LPF]
CRYSTALS: NONE SEEN [HPF]
Glucose, UA: NEGATIVE
HGB URINE DIPSTICK: NEGATIVE
KETONES UR: NEGATIVE
Leukocytes, UA: NEGATIVE
Nitrite: NEGATIVE
PROTEIN: NEGATIVE
SQUAMOUS EPITHELIAL / LPF: NONE SEEN [HPF] (ref <=5)
Specific Gravity, Urine: 1.015 (ref 1.001–1.035)
WBC, UA: NONE SEEN WBC/HPF (ref <=5)
YEAST: NONE SEEN [HPF]
pH: 6 (ref 5.0–8.0)

## 2015-02-07 LAB — GC/CHLAMYDIA PROBE AMP
CT Probe RNA: NOT DETECTED
GC PROBE AMP APTIMA: NOT DETECTED

## 2015-02-07 LAB — HIV ANTIBODY (ROUTINE TESTING W REFLEX): HIV: NONREACTIVE

## 2015-02-07 LAB — HEPATITIS C ANTIBODY: HCV AB: NEGATIVE

## 2015-02-07 LAB — RPR

## 2015-02-07 LAB — HEPATITIS B SURFACE ANTIGEN: Hepatitis B Surface Ag: NEGATIVE

## 2015-02-08 LAB — URINE CULTURE
Colony Count: NO GROWTH
Organism ID, Bacteria: NO GROWTH

## 2015-02-10 ENCOUNTER — Other Ambulatory Visit: Payer: Self-pay | Admitting: Women's Health

## 2015-02-10 DIAGNOSIS — E7800 Pure hypercholesterolemia, unspecified: Secondary | ICD-10-CM

## 2015-02-10 DIAGNOSIS — R748 Abnormal levels of other serum enzymes: Secondary | ICD-10-CM

## 2015-02-14 LAB — CYTOLOGY - PAP

## 2015-03-18 DIAGNOSIS — B009 Herpesviral infection, unspecified: Secondary | ICD-10-CM

## 2015-03-18 HISTORY — DX: Herpesviral infection, unspecified: B00.9

## 2015-04-07 ENCOUNTER — Ambulatory Visit (INDEPENDENT_AMBULATORY_CARE_PROVIDER_SITE_OTHER): Payer: BC Managed Care – PPO | Admitting: Gynecology

## 2015-04-07 ENCOUNTER — Encounter: Payer: Self-pay | Admitting: Gynecology

## 2015-04-07 VITALS — BP 120/76

## 2015-04-07 DIAGNOSIS — N766 Ulceration of vulva: Secondary | ICD-10-CM | POA: Diagnosis not present

## 2015-04-07 NOTE — Addendum Note (Signed)
Addended by: Dayna Barker on: 04/07/2015 03:26 PM   Modules accepted: Orders

## 2015-04-07 NOTE — Progress Notes (Signed)
Angela Bender 04-03-1970 [id number]        45 y.o.  [id number] Presents with a bump on the vulva noticed over the last 4 days. Placing alcohol over the area. Questions whether ingrown hair. Not particularly painful. No history of same before.  Past medical history,surgical history, problem list, medications, allergies, family history and social history were all reviewed and documented in the EPIC chart.  Directed ROS with pertinent positives and negatives documented in the history of present illness/assessment and plan.  Exam: Kennon Portela assistant Filed Vitals:   04/07/15 1442  BP: 120/76   General appearance:  Normal Abdomen soft nontender without masses guarding rebound Pelvic external BUS vagina with irregular superficial ulceration lower left our posterior fourchette. Vagina normal. Cervix normal. Uterus normal size midline mobile nontender. Adnexa without masses or tenderness. No evidence of inguinal adenopathy or tenderness  Assessment/Plan:  45 y.o. [id number] with superficial ulcerated irregular area. Questionable small tear versus HSV although not classic for it. Currently not sexually active. HSV screen done. Recommend antibiotic ointment over the area. If not healed in one week follow up for reexamination. Follow up for culture results. Discussed implications if HSV and issues as to whether to start suppressive therapy, treat recurrent lesions intermittently or follow expectantly.  Issues of transmission also discussed. Will follow up with culture results and further review as needed.    Dara Lords MD, 3:04 PM 04/07/2015

## 2015-04-07 NOTE — Patient Instructions (Signed)
Office will call you with the culture results. 

## 2015-04-09 ENCOUNTER — Encounter: Payer: Self-pay | Admitting: Gynecology

## 2015-04-09 ENCOUNTER — Other Ambulatory Visit: Payer: Self-pay | Admitting: Gynecology

## 2015-04-09 LAB — SURESWAB HSV, TYPE 1/2 DNA, PCR
HSV 1 DNA: NOT DETECTED
HSV 2 DNA: DETECTED — AB

## 2015-04-09 MED ORDER — VALACYCLOVIR HCL 500 MG PO TABS: ORAL_TABLET | ORAL | Status: AC

## 2015-04-09 MED ORDER — VALACYCLOVIR HCL 500 MG PO TABS: ORAL_TABLET | ORAL | Status: DC

## 2015-04-09 NOTE — Addendum Note (Signed)
Addended by: Aura Camps on: 04/09/2015 04:04 PM   Modules accepted: Orders

## 2016-04-27 ENCOUNTER — Telehealth: Payer: Self-pay

## 2016-04-27 NOTE — Telephone Encounter (Addendum)
Patient said she had Rx for Valtrex but no refills. She is currently with outbreak and needs Rx refilled.  I called patient back and left message to call. She mentioned in the message she left [place] She is overdue for annual and I asked her her plans for her CE.

## 2018-11-06 ENCOUNTER — Encounter: Payer: Self-pay | Admitting: Gynecology
